# Patient Record
Sex: Male | Born: 1970 | Hispanic: Refuse to answer | Marital: Married | Smoking: Never smoker
Health system: Southern US, Community
[De-identification: ages and names within clinical notes are randomized; demographics above are authoritative.]

## PROBLEM LIST (undated history)

## (undated) DIAGNOSIS — G4733 Obstructive sleep apnea (adult) (pediatric): Secondary | ICD-10-CM

## (undated) DIAGNOSIS — E782 Mixed hyperlipidemia: Secondary | ICD-10-CM

## (undated) DIAGNOSIS — E785 Hyperlipidemia, unspecified: Secondary | ICD-10-CM

## (undated) DIAGNOSIS — F330 Major depressive disorder, recurrent, mild: Secondary | ICD-10-CM

## (undated) DIAGNOSIS — F32A Depression, unspecified: Secondary | ICD-10-CM

## (undated) DIAGNOSIS — N529 Male erectile dysfunction, unspecified: Secondary | ICD-10-CM

## (undated) DIAGNOSIS — G4700 Insomnia, unspecified: Secondary | ICD-10-CM

## (undated) DIAGNOSIS — I10 Essential (primary) hypertension: Secondary | ICD-10-CM

## (undated) DIAGNOSIS — G4730 Sleep apnea, unspecified: Secondary | ICD-10-CM

## (undated) DIAGNOSIS — F439 Reaction to severe stress, unspecified: Secondary | ICD-10-CM

## (undated) DIAGNOSIS — F329 Major depressive disorder, single episode, unspecified: Secondary | ICD-10-CM

## (undated) DIAGNOSIS — M109 Gout, unspecified: Secondary | ICD-10-CM

## (undated) DIAGNOSIS — F419 Anxiety disorder, unspecified: Secondary | ICD-10-CM

## (undated) DIAGNOSIS — G43909 Migraine, unspecified, not intractable, without status migrainosus: Secondary | ICD-10-CM

## (undated) DIAGNOSIS — J309 Allergic rhinitis, unspecified: Secondary | ICD-10-CM

## (undated) DIAGNOSIS — J45909 Unspecified asthma, uncomplicated: Secondary | ICD-10-CM

## (undated) DIAGNOSIS — K219 Gastro-esophageal reflux disease without esophagitis: Secondary | ICD-10-CM

## (undated) DIAGNOSIS — E669 Obesity, unspecified: Secondary | ICD-10-CM

## (undated) DIAGNOSIS — T7840XA Allergy, unspecified, initial encounter: Secondary | ICD-10-CM

## (undated) HISTORY — DX: Allergic rhinitis, unspecified: J30.9

## (undated) HISTORY — DX: Unspecified asthma, uncomplicated: J45.909

## (undated) HISTORY — DX: Major depressive disorder, single episode, unspecified: F32.9

## (undated) HISTORY — DX: Obstructive sleep apnea (adult) (pediatric): G47.33

## (undated) HISTORY — DX: Reaction to severe stress, unspecified: F43.9

## (undated) HISTORY — PX: TONSILLECTOMY: SUR1361

## (undated) HISTORY — DX: Gastro-esophageal reflux disease without esophagitis: K21.9

## (undated) HISTORY — DX: Major depressive disorder, recurrent, mild: F33.0

## (undated) HISTORY — DX: Allergy, unspecified, initial encounter: T78.40XA

## (undated) HISTORY — PX: COLONOSCOPY: SHX174

## (undated) HISTORY — DX: Migraine, unspecified, not intractable, without status migrainosus: G43.909

## (undated) HISTORY — DX: Mixed hyperlipidemia: E78.2

## (undated) HISTORY — DX: Essential (primary) hypertension: I10

## (undated) HISTORY — DX: Male erectile dysfunction, unspecified: N52.9

## (undated) HISTORY — DX: Insomnia, unspecified: G47.00

## (undated) HISTORY — DX: Obesity, unspecified: E66.9

## (undated) HISTORY — DX: Gout, unspecified: M10.9

## (undated) HISTORY — DX: Depression, unspecified: F32.A

## (undated) HISTORY — DX: Hyperlipidemia, unspecified: E78.5

## (undated) HISTORY — DX: Anxiety disorder, unspecified: F41.9

## (undated) HISTORY — DX: Sleep apnea, unspecified: G47.30

---

## 2006-03-02 ENCOUNTER — Encounter: Admission: RE | Admit: 2006-03-02 | Discharge: 2006-03-02 | Payer: Self-pay | Admitting: Family Medicine

## 2006-03-30 ENCOUNTER — Ambulatory Visit (HOSPITAL_BASED_OUTPATIENT_CLINIC_OR_DEPARTMENT_OTHER): Admission: RE | Admit: 2006-03-30 | Discharge: 2006-03-30 | Payer: Self-pay | Admitting: Family Medicine

## 2006-04-02 ENCOUNTER — Ambulatory Visit: Payer: Self-pay | Admitting: Internal Medicine

## 2006-09-02 ENCOUNTER — Encounter: Admission: RE | Admit: 2006-09-02 | Discharge: 2006-09-02 | Payer: Self-pay | Admitting: Family Medicine

## 2006-12-06 ENCOUNTER — Encounter: Admission: RE | Admit: 2006-12-06 | Discharge: 2006-12-06 | Payer: Self-pay | Admitting: Family Medicine

## 2009-11-12 ENCOUNTER — Encounter: Admission: RE | Admit: 2009-11-12 | Discharge: 2009-11-12 | Payer: Self-pay | Admitting: Sports Medicine

## 2010-02-18 ENCOUNTER — Ambulatory Visit (HOSPITAL_COMMUNITY): Admission: AD | Admit: 2010-02-18 | Discharge: 2010-02-18 | Payer: Self-pay | Admitting: Cardiology

## 2010-04-25 ENCOUNTER — Inpatient Hospital Stay (HOSPITAL_COMMUNITY)
Admission: AD | Admit: 2010-04-25 | Discharge: 2010-04-27 | Payer: Self-pay | Source: Home / Self Care | Admitting: Psychiatry

## 2010-05-01 ENCOUNTER — Ambulatory Visit (HOSPITAL_COMMUNITY)
Admission: RE | Admit: 2010-05-01 | Discharge: 2010-05-01 | Payer: Self-pay | Source: Home / Self Care | Attending: Psychiatry | Admitting: Psychiatry

## 2010-05-05 ENCOUNTER — Other Ambulatory Visit (HOSPITAL_COMMUNITY): Admission: RE | Admit: 2010-05-05 | Payer: Self-pay | Source: Home / Self Care | Admitting: Psychiatry

## 2010-05-07 ENCOUNTER — Inpatient Hospital Stay (HOSPITAL_COMMUNITY)
Admission: RE | Admit: 2010-05-07 | Discharge: 2010-05-15 | Payer: Self-pay | Source: Home / Self Care | Attending: Psychiatry | Admitting: Psychiatry

## 2010-08-03 LAB — URINE DRUGS OF ABUSE SCREEN W ALC, ROUTINE (REF LAB)
Amphetamine Screen, Ur: NEGATIVE
Barbiturate Quant, Ur: NEGATIVE
Benzodiazepines.: POSITIVE — AB
Cocaine Metabolites: NEGATIVE
Creatinine,U: 298.8 mg/dL
Ethyl Alcohol: <10 mg/dL (ref <10)
Marijuana Metabolite: NEGATIVE
Methadone: NEGATIVE
Opiate Screen, Urine: NEGATIVE
Phencyclidine (PCP): NEGATIVE
Propoxyphene: NEGATIVE

## 2010-08-03 LAB — COMPREHENSIVE METABOLIC PANEL
ALT: 29 U/L (ref 0–53)
AST: 17 U/L (ref 0–37)
Albumin: 3.6 g/dL (ref 3.5–5.2)
Alkaline Phosphatase: 64 U/L (ref 39–117)
BUN: 16 mg/dL (ref 6–23)
CO2: 30 mEq/L (ref 19–32)
Calcium: 9.4 mg/dL (ref 8.4–10.5)
Chloride: 104 mEq/L (ref 96–112)
Creatinine, Ser: 1.07 mg/dL (ref 0.4–1.5)
GFR calc Af Amer: >60 mL/min (ref >60)
GFR calc non Af Amer: >60 mL/min (ref >60)
Glucose, Bld: 86 mg/dL (ref 70–99)
Potassium: 3.6 mEq/L (ref 3.5–5.1)
Sodium: 142 mEq/L (ref 135–145)
Total Bilirubin: 0.5 mg/dL (ref 0.3–1.2)
Total Protein: 7.1 g/dL (ref 6.0–8.3)

## 2010-08-03 LAB — BENZODIAZEPINE, QUANTITATIVE, URINE
Alprazolam (GC/LC/MS), ur confirm: NEGATIVE NG/ML
Flurazepam GC/MS Conf: NEGATIVE NG/ML
Lorazepam UR QT: NEGATIVE NG/ML
Nordiazepam GC/MS Conf: NEGATIVE NG/ML
Oxazepam GC/MS Conf: 368 NG/ML — ABNORMAL HIGH
Temazepam GC/MS Conf: NEGATIVE NG/ML

## 2010-08-04 LAB — CBC
HCT: 40.3 % (ref 39.0–52.0)
Hemoglobin: 13.9 g/dL (ref 13.0–17.0)
MCH: 28.5 pg (ref 26.0–34.0)
MCHC: 34.5 g/dL (ref 30.0–36.0)
MCV: 82.8 fL (ref 78.0–100.0)
Platelets: 267 10*3/uL (ref 150–400)
RBC: 4.87 MIL/uL (ref 4.22–5.81)
RDW: 15 % (ref 11.5–15.5)
WBC: 10.8 10*3/uL — ABNORMAL HIGH (ref 4.0–10.5)

## 2010-08-04 LAB — COMPREHENSIVE METABOLIC PANEL
ALT: 32 U/L (ref 0–53)
AST: 21 U/L (ref 0–37)
Albumin: 4 g/dL (ref 3.5–5.2)
Alkaline Phosphatase: 50 U/L (ref 39–117)
BUN: 12 mg/dL (ref 6–23)
CO2: 28 mEq/L (ref 19–32)
Calcium: 9.7 mg/dL (ref 8.4–10.5)
Chloride: 101 mEq/L (ref 96–112)
Creatinine, Ser: 1.08 mg/dL (ref 0.4–1.5)
GFR calc Af Amer: >60 mL/min (ref >60)
GFR calc non Af Amer: >60 mL/min (ref >60)
Glucose, Bld: 100 mg/dL — ABNORMAL HIGH (ref 70–99)
Potassium: 3.8 mEq/L (ref 3.5–5.1)
Sodium: 140 mEq/L (ref 135–145)
Total Bilirubin: 0.6 mg/dL (ref 0.3–1.2)
Total Protein: 7.2 g/dL (ref 6.0–8.3)

## 2010-08-04 LAB — TSH: TSH: 0.999 u[IU]/mL (ref 0.350–4.500)

## 2010-08-06 LAB — CBC
HCT: 44.2 % (ref 39.0–52.0)
Hemoglobin: 15.3 g/dL (ref 13.0–17.0)
MCH: 28.3 pg (ref 26.0–34.0)
MCHC: 34.6 g/dL (ref 30.0–36.0)
MCV: 81.9 fL (ref 78.0–100.0)
Platelets: 217 10*3/uL (ref 150–400)
RBC: 5.4 MIL/uL (ref 4.22–5.81)
RDW: 14.1 % (ref 11.5–15.5)
WBC: 9.9 10*3/uL (ref 4.0–10.5)

## 2010-08-06 LAB — BASIC METABOLIC PANEL
BUN: 10 mg/dL (ref 6–23)
CO2: 28 mEq/L (ref 19–32)
Calcium: 9.8 mg/dL (ref 8.4–10.5)
Chloride: 105 mEq/L (ref 96–112)
Creatinine, Ser: 1.07 mg/dL (ref 0.4–1.5)
GFR calc Af Amer: >60 mL/min (ref >60)
GFR calc non Af Amer: >60 mL/min (ref >60)
Glucose, Bld: 112 mg/dL — ABNORMAL HIGH (ref 70–99)
Potassium: 3.7 mEq/L (ref 3.5–5.1)
Sodium: 139 mEq/L (ref 135–145)

## 2010-08-06 LAB — PROTIME-INR
INR: 1 (ref 0.00–1.49)
Prothrombin Time: 13.4 seconds (ref 11.6–15.2)

## 2010-08-06 LAB — APTT: aPTT: 26 seconds (ref 24–37)

## 2010-10-09 NOTE — Procedures (Signed)
NAME:  Ronald Roy, BUSCHE            ACCOUNT NO.:  [id number]   MEDICAL RECORD NO.:  [id number]          PATIENT TYPE:  OUT   LOCATION:  SLEEP CENTER                 FACILITY:  [facility]   PHYSICIAN:  Clinton D. Maple Hudson, MD, FCCP, FACPDATE OF BIRTH:  03/04/70   DATE OF STUDY:  03/30/2006                              NOCTURNAL POLYSOMNOGRAM   REFERRING PHYSICIAN:  Quita Skye. Artis Flock, M.D.   INDICATION FOR STUDY:  Hypersomnia with sleep apnea.   EPWORTH SLEEPINESS SCORE:  9/24, BMI 40, weight 300 pounds.   HOME MEDICATIONS:  Atarax, Klonopin.   SLEEP ARCHITECTURE:  Total sleep time 364 minutes with sleep efficiency 76%.  Stage I was 15%, stage II 49%, stages III and IV 13%, REM 22% of total sleep  time.  Sleep latency 25 minutes, REM latency 261 minutes.  Awake after sleep  onset 93 minutes.  Arousal index 29.8.  Atarax and Klonopin were taken at  2145 hours.   RESPIRATORY DATA:  A split study protocol.  Apnea-hypopnea index (AHI, RDI)  19.1 obstructive events per hour, indicating moderate obstructive sleep  apnea/hypopnea syndrome.  This included 10 obstructive apneas and 30  hypopneas before CPAP.  Events were not positional.  REM AHI 2.2 per hour.  CPAP was titrated to 17 CWP, AHI 1 per hour.  A medium ResMed Ultra Mirage  full face mask was used with a heated humidifier.   OXYGEN DATA:  Loud snoring with oxygen desaturation to a nadir of 88% before  CPAP.  After CPAP control, saturation held 96% on room air.   CARDIAC DATA:  Normal sinus rhythm.   MOVEMENT-PARASOMNIA:  A total of 48 limb jerks were recorded, of which 39  were associated with arousal or awakening for a periodic limb movement with  arousal index of 6.3 per hour, which is increased.   IMPRESSIONS-RECOMMENDATIONS:  1. Moderate obstructive sleep apnea/hypopnea syndrome, AHI 19.1 per hour,      with nonpositional events, loud snoring and oxygen desaturation to 88%.  2. Successful CPAP titration to 17 CWP, AHI 1 per hour.   A medium ResMed      Ultra Mirage full face mask was      used with a heated humidifier.  3. Periodic limb movement with arousal, 6.3 per hour.      Clinton D. Maple Hudson, MD, [facility], FACP  Diplomate, Biomedical engineer of Sleep Medicine  Electronically Signed     CDY/MEDQ  D:  04/02/2006 11:52:17  T:  04/02/2006 15:49:43  Job:  [id number]

## 2012-07-10 ENCOUNTER — Other Ambulatory Visit (HOSPITAL_COMMUNITY): Payer: Self-pay | Admitting: Orthopedic Surgery

## 2012-07-10 DIAGNOSIS — M25562 Pain in left knee: Secondary | ICD-10-CM

## 2012-07-11 ENCOUNTER — Ambulatory Visit (HOSPITAL_COMMUNITY)
Admission: RE | Admit: 2012-07-11 | Discharge: 2012-07-11 | Disposition: A | Discharge status: Home / Self Care | Payer: BC Managed Care – PPO | Source: Ambulatory Visit | Attending: Orthopedic Surgery | Admitting: Orthopedic Surgery

## 2012-07-11 DIAGNOSIS — M2390 Unspecified internal derangement of unspecified knee: Secondary | ICD-10-CM | POA: Insufficient documentation

## 2012-07-11 DIAGNOSIS — M25562 Pain in left knee: Secondary | ICD-10-CM

## 2012-07-11 DIAGNOSIS — M25569 Pain in unspecified knee: Secondary | ICD-10-CM | POA: Insufficient documentation

## 2015-02-27 ENCOUNTER — Other Ambulatory Visit: Payer: Self-pay | Admitting: Orthopedic Surgery

## 2015-02-27 DIAGNOSIS — M545 Low back pain: Secondary | ICD-10-CM

## 2015-03-13 ENCOUNTER — Other Ambulatory Visit: Payer: Self-pay

## 2015-03-18 ENCOUNTER — Ambulatory Visit
Admission: RE | Admit: 2015-03-18 | Discharge: 2015-03-18 | Disposition: A | Discharge status: Home / Self Care | Payer: BLUE CROSS/BLUE SHIELD | Source: Ambulatory Visit | Attending: Orthopedic Surgery | Admitting: Orthopedic Surgery

## 2015-03-18 DIAGNOSIS — M545 Low back pain: Secondary | ICD-10-CM

## 2015-07-03 ENCOUNTER — Ambulatory Visit (INDEPENDENT_AMBULATORY_CARE_PROVIDER_SITE_OTHER): Payer: 59 | Admitting: Physician Assistant

## 2015-07-03 VITALS — BP 132/80 | HR 79 | Temp 98.2°F | Resp 18 | Ht 72.0 in | Wt 330.8 lb

## 2015-07-03 DIAGNOSIS — J020 Streptococcal pharyngitis: Secondary | ICD-10-CM | POA: Diagnosis not present

## 2015-07-03 DIAGNOSIS — J069 Acute upper respiratory infection, unspecified: Secondary | ICD-10-CM | POA: Diagnosis not present

## 2015-07-03 DIAGNOSIS — J029 Acute pharyngitis, unspecified: Secondary | ICD-10-CM

## 2015-07-03 LAB — POCT RAPID STREP A (OFFICE): Rapid Strep A Screen: NEGATIVE

## 2015-07-03 NOTE — Patient Instructions (Signed)
Return if symptoms not improving in 1 week

## 2015-07-03 NOTE — Progress Notes (Signed)
[facility] [address], [place] [address] [phone]  Date:  07/03/2015   Name:  Ronald Roy   DOB:  December 26, 1970   MRN:  [id number]  PCP:  Ronald Kroner, MD    Chief Complaint: Sore Throat   History of Present Illness:  This is a 45 y.o. male who is presenting with mild sore throat, mild cough and mild nasal congestion. States he figures it is just a cold but wanted to make sure he does not have strep. States his girlfriend was recently dx'd with strep. He denies fever, chills, sob, wheezing, otalgia. Not taking anything for current symptoms.  Review of Systems:  Review of Systems See HPI  There are no active problems to display for this patient.   Prior to Admission medications   Medication Sig Start Date End Date Taking? Authorizing Provider  carvedilol (COREG) 6.25 MG tablet Take 6.25 mg by mouth 2 (two) times daily with a meal.   Yes Historical Provider, MD  valsartan (DIOVAN) 160 MG tablet Take 160 mg by mouth daily.   Yes Historical Provider, MD    No Known Allergies  History reviewed. No pertinent past surgical history.  Social History  Substance Use Topics  . Smoking status: Never Smoker   . Smokeless tobacco: None  . Alcohol Use: No    Family History  Problem Relation Age of Onset  . Hypertension Mother   . Diabetes Father   . Hypertension Father   . Hypertension Sister   . Hypertension Maternal Grandmother   . Cancer Maternal Grandfather   . Cancer Paternal Grandmother   . Cancer Paternal Grandfather     Medication list has been reviewed and updated.  Physical Examination:  Physical Exam  Constitutional: He is oriented to person, place, and time. He appears well-developed and well-nourished. No distress.  HENT:  Head: Normocephalic and atraumatic.  Right Ear: Hearing, tympanic membrane, external ear and ear canal normal.  Left Ear: Hearing, tympanic membrane, external ear and ear canal normal.  Nose: Nose  normal.  Mouth/Throat: Uvula is midline and mucous membranes are normal. Posterior oropharyngeal erythema (mild) present.  Eyes: Conjunctivae and lids are normal. Right eye exhibits no discharge. Left eye exhibits no discharge. No scleral icterus.  Cardiovascular: Normal rate, regular rhythm, normal heart sounds and normal pulses.   No murmur heard. Pulmonary/Chest: Effort normal and breath sounds normal. No respiratory distress. He has no wheezes. He has no rhonchi. He has no rales.  Musculoskeletal: Normal range of motion.  Lymphadenopathy:       Head (right side): No submental, no submandibular and no tonsillar adenopathy present.       Head (left side): No submental, no submandibular and no tonsillar adenopathy present.    He has no cervical adenopathy.  Neurological: He is alert and oriented to person, place, and time.  Skin: Skin is warm, dry and intact. No lesion and no rash noted.  Psychiatric: He has a normal mood and affect. His speech is normal and behavior is normal. Thought content normal.    BP 132/80 mmHg  Pulse 79  Temp(Src) 98.2 F (36.8 C) (Oral)  Resp 18  Ht 6' (1.829 m)  Wt 330 lb 12.8 oz (150.05 kg)  BMI 44.85 kg/m2  SpO2 98%  Results for orders placed or performed in visit on 07/03/15  POCT rapid strep A  Result Value Ref Range   Rapid Strep A Screen Negative Negative    Assessment and  Plan:  1. Viral URI 2. Sore throat Rapid strep negative, culture pending. Treat as viral illness. Discussed supportive care. Return in 1 week if symptoms do not improve or at any time if symptoms worsen.  - POCT rapid strep A - Culture, Group A Strep   Ronald Roy. Ronald Roy, MHS [facility]  07/03/2015

## 2015-07-05 LAB — CULTURE, GROUP A STREP: Organism ID, Bacteria: NORMAL

## 2016-03-03 ENCOUNTER — Other Ambulatory Visit: Payer: Self-pay | Admitting: Family Medicine

## 2016-03-03 DIAGNOSIS — N5089 Other specified disorders of the male genital organs: Secondary | ICD-10-CM

## 2016-05-28 DIAGNOSIS — R6889 Other general symptoms and signs: Secondary | ICD-10-CM | POA: Diagnosis not present

## 2016-06-02 DIAGNOSIS — M109 Gout, unspecified: Secondary | ICD-10-CM | POA: Diagnosis not present

## 2016-06-02 DIAGNOSIS — I10 Essential (primary) hypertension: Secondary | ICD-10-CM | POA: Diagnosis not present

## 2016-07-27 DIAGNOSIS — M10072 Idiopathic gout, left ankle and foot: Secondary | ICD-10-CM | POA: Diagnosis not present

## 2016-07-30 DIAGNOSIS — M79673 Pain in unspecified foot: Secondary | ICD-10-CM | POA: Diagnosis not present

## 2016-07-30 DIAGNOSIS — M109 Gout, unspecified: Secondary | ICD-10-CM | POA: Diagnosis not present

## 2016-07-30 DIAGNOSIS — Z79899 Other long term (current) drug therapy: Secondary | ICD-10-CM | POA: Diagnosis not present

## 2016-07-30 DIAGNOSIS — M79672 Pain in left foot: Secondary | ICD-10-CM | POA: Diagnosis not present

## 2016-08-05 DIAGNOSIS — G4733 Obstructive sleep apnea (adult) (pediatric): Secondary | ICD-10-CM | POA: Diagnosis not present

## 2016-08-25 DIAGNOSIS — J321 Chronic frontal sinusitis: Secondary | ICD-10-CM | POA: Diagnosis not present

## 2016-08-30 DIAGNOSIS — M79673 Pain in unspecified foot: Secondary | ICD-10-CM | POA: Diagnosis not present

## 2016-08-30 DIAGNOSIS — M109 Gout, unspecified: Secondary | ICD-10-CM | POA: Diagnosis not present

## 2016-08-30 DIAGNOSIS — Z79899 Other long term (current) drug therapy: Secondary | ICD-10-CM | POA: Diagnosis not present

## 2016-09-29 DIAGNOSIS — G4733 Obstructive sleep apnea (adult) (pediatric): Secondary | ICD-10-CM | POA: Diagnosis not present

## 2016-10-01 DIAGNOSIS — M79673 Pain in unspecified foot: Secondary | ICD-10-CM | POA: Diagnosis not present

## 2016-10-01 DIAGNOSIS — Z79899 Other long term (current) drug therapy: Secondary | ICD-10-CM | POA: Diagnosis not present

## 2016-10-01 DIAGNOSIS — M109 Gout, unspecified: Secondary | ICD-10-CM | POA: Diagnosis not present

## 2016-10-22 DIAGNOSIS — E782 Mixed hyperlipidemia: Secondary | ICD-10-CM | POA: Diagnosis not present

## 2016-10-22 DIAGNOSIS — G43909 Migraine, unspecified, not intractable, without status migrainosus: Secondary | ICD-10-CM | POA: Diagnosis not present

## 2016-10-22 DIAGNOSIS — I10 Essential (primary) hypertension: Secondary | ICD-10-CM | POA: Diagnosis not present

## 2016-11-01 DIAGNOSIS — G4733 Obstructive sleep apnea (adult) (pediatric): Secondary | ICD-10-CM | POA: Diagnosis not present

## 2016-11-09 ENCOUNTER — Ambulatory Visit: Payer: BLUE CROSS/BLUE SHIELD | Admitting: Registered"

## 2016-11-16 DIAGNOSIS — M79673 Pain in unspecified foot: Secondary | ICD-10-CM | POA: Diagnosis not present

## 2016-11-16 DIAGNOSIS — M109 Gout, unspecified: Secondary | ICD-10-CM | POA: Diagnosis not present

## 2016-11-16 DIAGNOSIS — Z79899 Other long term (current) drug therapy: Secondary | ICD-10-CM | POA: Diagnosis not present

## 2016-11-29 DIAGNOSIS — G4733 Obstructive sleep apnea (adult) (pediatric): Secondary | ICD-10-CM | POA: Diagnosis not present

## 2016-11-30 ENCOUNTER — Ambulatory Visit: Payer: BLUE CROSS/BLUE SHIELD | Admitting: Registered"

## 2016-12-02 DIAGNOSIS — M109 Gout, unspecified: Secondary | ICD-10-CM | POA: Diagnosis not present

## 2016-12-27 DIAGNOSIS — G4733 Obstructive sleep apnea (adult) (pediatric): Secondary | ICD-10-CM | POA: Diagnosis not present

## 2017-01-10 DIAGNOSIS — M109 Gout, unspecified: Secondary | ICD-10-CM | POA: Diagnosis not present

## 2017-01-10 DIAGNOSIS — Z79899 Other long term (current) drug therapy: Secondary | ICD-10-CM | POA: Diagnosis not present

## 2017-01-10 DIAGNOSIS — M79673 Pain in unspecified foot: Secondary | ICD-10-CM | POA: Diagnosis not present

## 2017-04-06 DIAGNOSIS — G4733 Obstructive sleep apnea (adult) (pediatric): Secondary | ICD-10-CM | POA: Diagnosis not present

## 2017-04-25 DIAGNOSIS — M5126 Other intervertebral disc displacement, lumbar region: Secondary | ICD-10-CM | POA: Diagnosis not present

## 2017-04-25 DIAGNOSIS — M5127 Other intervertebral disc displacement, lumbosacral region: Secondary | ICD-10-CM | POA: Diagnosis not present

## 2017-04-25 DIAGNOSIS — M9903 Segmental and somatic dysfunction of lumbar region: Secondary | ICD-10-CM | POA: Diagnosis not present

## 2017-04-28 DIAGNOSIS — M9903 Segmental and somatic dysfunction of lumbar region: Secondary | ICD-10-CM | POA: Diagnosis not present

## 2017-04-28 DIAGNOSIS — M5126 Other intervertebral disc displacement, lumbar region: Secondary | ICD-10-CM | POA: Diagnosis not present

## 2017-04-28 DIAGNOSIS — M5127 Other intervertebral disc displacement, lumbosacral region: Secondary | ICD-10-CM | POA: Diagnosis not present

## 2017-05-03 DIAGNOSIS — M5127 Other intervertebral disc displacement, lumbosacral region: Secondary | ICD-10-CM | POA: Diagnosis not present

## 2017-05-03 DIAGNOSIS — M9903 Segmental and somatic dysfunction of lumbar region: Secondary | ICD-10-CM | POA: Diagnosis not present

## 2017-05-03 DIAGNOSIS — M5126 Other intervertebral disc displacement, lumbar region: Secondary | ICD-10-CM | POA: Diagnosis not present

## 2017-05-05 DIAGNOSIS — M5127 Other intervertebral disc displacement, lumbosacral region: Secondary | ICD-10-CM | POA: Diagnosis not present

## 2017-05-05 DIAGNOSIS — M5126 Other intervertebral disc displacement, lumbar region: Secondary | ICD-10-CM | POA: Diagnosis not present

## 2017-05-05 DIAGNOSIS — M9903 Segmental and somatic dysfunction of lumbar region: Secondary | ICD-10-CM | POA: Diagnosis not present

## 2017-05-09 DIAGNOSIS — M5127 Other intervertebral disc displacement, lumbosacral region: Secondary | ICD-10-CM | POA: Diagnosis not present

## 2017-05-09 DIAGNOSIS — M9903 Segmental and somatic dysfunction of lumbar region: Secondary | ICD-10-CM | POA: Diagnosis not present

## 2017-05-09 DIAGNOSIS — M5126 Other intervertebral disc displacement, lumbar region: Secondary | ICD-10-CM | POA: Diagnosis not present

## 2017-05-11 DIAGNOSIS — M5127 Other intervertebral disc displacement, lumbosacral region: Secondary | ICD-10-CM | POA: Diagnosis not present

## 2017-05-11 DIAGNOSIS — M9903 Segmental and somatic dysfunction of lumbar region: Secondary | ICD-10-CM | POA: Diagnosis not present

## 2017-05-11 DIAGNOSIS — M5126 Other intervertebral disc displacement, lumbar region: Secondary | ICD-10-CM | POA: Diagnosis not present

## 2017-06-10 DIAGNOSIS — Z23 Encounter for immunization: Secondary | ICD-10-CM | POA: Diagnosis not present

## 2017-06-10 DIAGNOSIS — I10 Essential (primary) hypertension: Secondary | ICD-10-CM | POA: Diagnosis not present

## 2017-06-10 DIAGNOSIS — G43909 Migraine, unspecified, not intractable, without status migrainosus: Secondary | ICD-10-CM | POA: Diagnosis not present

## 2017-06-10 DIAGNOSIS — E782 Mixed hyperlipidemia: Secondary | ICD-10-CM | POA: Diagnosis not present

## 2017-06-22 DIAGNOSIS — M109 Gout, unspecified: Secondary | ICD-10-CM | POA: Diagnosis not present

## 2017-06-23 DIAGNOSIS — Z79899 Other long term (current) drug therapy: Secondary | ICD-10-CM | POA: Diagnosis not present

## 2017-06-23 DIAGNOSIS — M109 Gout, unspecified: Secondary | ICD-10-CM | POA: Diagnosis not present

## 2017-06-23 DIAGNOSIS — M79673 Pain in unspecified foot: Secondary | ICD-10-CM | POA: Diagnosis not present

## 2017-06-27 DIAGNOSIS — E782 Mixed hyperlipidemia: Secondary | ICD-10-CM | POA: Diagnosis not present

## 2017-06-27 DIAGNOSIS — M109 Gout, unspecified: Secondary | ICD-10-CM | POA: Diagnosis not present

## 2017-07-02 DIAGNOSIS — J069 Acute upper respiratory infection, unspecified: Secondary | ICD-10-CM | POA: Diagnosis not present

## 2017-07-04 DIAGNOSIS — R05 Cough: Secondary | ICD-10-CM | POA: Diagnosis not present

## 2017-07-04 DIAGNOSIS — J0101 Acute recurrent maxillary sinusitis: Secondary | ICD-10-CM | POA: Diagnosis not present

## 2017-07-13 DIAGNOSIS — G4733 Obstructive sleep apnea (adult) (pediatric): Secondary | ICD-10-CM | POA: Diagnosis not present

## 2017-07-18 DIAGNOSIS — H5213 Myopia, bilateral: Secondary | ICD-10-CM | POA: Diagnosis not present

## 2017-09-20 DIAGNOSIS — E790 Hyperuricemia without signs of inflammatory arthritis and tophaceous disease: Secondary | ICD-10-CM | POA: Diagnosis not present

## 2017-09-20 DIAGNOSIS — Z79899 Other long term (current) drug therapy: Secondary | ICD-10-CM | POA: Diagnosis not present

## 2017-09-20 DIAGNOSIS — M109 Gout, unspecified: Secondary | ICD-10-CM | POA: Diagnosis not present

## 2017-10-18 DIAGNOSIS — G4733 Obstructive sleep apnea (adult) (pediatric): Secondary | ICD-10-CM | POA: Diagnosis not present

## 2017-10-25 DIAGNOSIS — G4733 Obstructive sleep apnea (adult) (pediatric): Secondary | ICD-10-CM | POA: Diagnosis not present

## 2017-10-25 DIAGNOSIS — G4700 Insomnia, unspecified: Secondary | ICD-10-CM | POA: Diagnosis not present

## 2017-11-11 DIAGNOSIS — R05 Cough: Secondary | ICD-10-CM | POA: Diagnosis not present

## 2017-11-11 DIAGNOSIS — J301 Allergic rhinitis due to pollen: Secondary | ICD-10-CM | POA: Diagnosis not present

## 2017-11-11 DIAGNOSIS — J3081 Allergic rhinitis due to animal (cat) (dog) hair and dander: Secondary | ICD-10-CM | POA: Diagnosis not present

## 2017-11-11 DIAGNOSIS — J3089 Other allergic rhinitis: Secondary | ICD-10-CM | POA: Diagnosis not present

## 2017-11-23 DIAGNOSIS — J3089 Other allergic rhinitis: Secondary | ICD-10-CM | POA: Diagnosis not present

## 2017-11-23 DIAGNOSIS — J301 Allergic rhinitis due to pollen: Secondary | ICD-10-CM | POA: Diagnosis not present

## 2017-11-28 DIAGNOSIS — J3081 Allergic rhinitis due to animal (cat) (dog) hair and dander: Secondary | ICD-10-CM | POA: Diagnosis not present

## 2017-11-30 DIAGNOSIS — J3081 Allergic rhinitis due to animal (cat) (dog) hair and dander: Secondary | ICD-10-CM | POA: Diagnosis not present

## 2017-11-30 DIAGNOSIS — J3089 Other allergic rhinitis: Secondary | ICD-10-CM | POA: Diagnosis not present

## 2017-11-30 DIAGNOSIS — J301 Allergic rhinitis due to pollen: Secondary | ICD-10-CM | POA: Diagnosis not present

## 2017-12-05 DIAGNOSIS — J3081 Allergic rhinitis due to animal (cat) (dog) hair and dander: Secondary | ICD-10-CM | POA: Diagnosis not present

## 2017-12-05 DIAGNOSIS — J3089 Other allergic rhinitis: Secondary | ICD-10-CM | POA: Diagnosis not present

## 2017-12-05 DIAGNOSIS — J301 Allergic rhinitis due to pollen: Secondary | ICD-10-CM | POA: Diagnosis not present

## 2017-12-07 DIAGNOSIS — J3089 Other allergic rhinitis: Secondary | ICD-10-CM | POA: Diagnosis not present

## 2017-12-07 DIAGNOSIS — J301 Allergic rhinitis due to pollen: Secondary | ICD-10-CM | POA: Diagnosis not present

## 2017-12-08 DIAGNOSIS — E782 Mixed hyperlipidemia: Secondary | ICD-10-CM | POA: Diagnosis not present

## 2017-12-08 DIAGNOSIS — G4733 Obstructive sleep apnea (adult) (pediatric): Secondary | ICD-10-CM | POA: Diagnosis not present

## 2017-12-08 DIAGNOSIS — I10 Essential (primary) hypertension: Secondary | ICD-10-CM | POA: Diagnosis not present

## 2017-12-09 DIAGNOSIS — J3089 Other allergic rhinitis: Secondary | ICD-10-CM | POA: Diagnosis not present

## 2017-12-09 DIAGNOSIS — J3081 Allergic rhinitis due to animal (cat) (dog) hair and dander: Secondary | ICD-10-CM | POA: Diagnosis not present

## 2017-12-09 DIAGNOSIS — J301 Allergic rhinitis due to pollen: Secondary | ICD-10-CM | POA: Diagnosis not present

## 2017-12-12 DIAGNOSIS — J301 Allergic rhinitis due to pollen: Secondary | ICD-10-CM | POA: Diagnosis not present

## 2017-12-12 DIAGNOSIS — J3081 Allergic rhinitis due to animal (cat) (dog) hair and dander: Secondary | ICD-10-CM | POA: Diagnosis not present

## 2017-12-12 DIAGNOSIS — J3089 Other allergic rhinitis: Secondary | ICD-10-CM | POA: Diagnosis not present

## 2017-12-14 DIAGNOSIS — J301 Allergic rhinitis due to pollen: Secondary | ICD-10-CM | POA: Diagnosis not present

## 2017-12-14 DIAGNOSIS — J3081 Allergic rhinitis due to animal (cat) (dog) hair and dander: Secondary | ICD-10-CM | POA: Diagnosis not present

## 2017-12-14 DIAGNOSIS — J3089 Other allergic rhinitis: Secondary | ICD-10-CM | POA: Diagnosis not present

## 2017-12-16 DIAGNOSIS — J3081 Allergic rhinitis due to animal (cat) (dog) hair and dander: Secondary | ICD-10-CM | POA: Diagnosis not present

## 2017-12-16 DIAGNOSIS — J301 Allergic rhinitis due to pollen: Secondary | ICD-10-CM | POA: Diagnosis not present

## 2017-12-16 DIAGNOSIS — J3089 Other allergic rhinitis: Secondary | ICD-10-CM | POA: Diagnosis not present

## 2017-12-19 DIAGNOSIS — J3089 Other allergic rhinitis: Secondary | ICD-10-CM | POA: Diagnosis not present

## 2017-12-19 DIAGNOSIS — J3081 Allergic rhinitis due to animal (cat) (dog) hair and dander: Secondary | ICD-10-CM | POA: Diagnosis not present

## 2017-12-19 DIAGNOSIS — J301 Allergic rhinitis due to pollen: Secondary | ICD-10-CM | POA: Diagnosis not present

## 2017-12-26 DIAGNOSIS — J3089 Other allergic rhinitis: Secondary | ICD-10-CM | POA: Diagnosis not present

## 2017-12-26 DIAGNOSIS — J3081 Allergic rhinitis due to animal (cat) (dog) hair and dander: Secondary | ICD-10-CM | POA: Diagnosis not present

## 2017-12-26 DIAGNOSIS — J301 Allergic rhinitis due to pollen: Secondary | ICD-10-CM | POA: Diagnosis not present

## 2017-12-28 DIAGNOSIS — J301 Allergic rhinitis due to pollen: Secondary | ICD-10-CM | POA: Diagnosis not present

## 2017-12-28 DIAGNOSIS — R05 Cough: Secondary | ICD-10-CM | POA: Diagnosis not present

## 2017-12-28 DIAGNOSIS — J4530 Mild persistent asthma, uncomplicated: Secondary | ICD-10-CM | POA: Diagnosis not present

## 2017-12-28 DIAGNOSIS — J3089 Other allergic rhinitis: Secondary | ICD-10-CM | POA: Diagnosis not present

## 2017-12-28 DIAGNOSIS — J3081 Allergic rhinitis due to animal (cat) (dog) hair and dander: Secondary | ICD-10-CM | POA: Diagnosis not present

## 2017-12-30 DIAGNOSIS — J3081 Allergic rhinitis due to animal (cat) (dog) hair and dander: Secondary | ICD-10-CM | POA: Diagnosis not present

## 2017-12-30 DIAGNOSIS — J3089 Other allergic rhinitis: Secondary | ICD-10-CM | POA: Diagnosis not present

## 2017-12-30 DIAGNOSIS — J301 Allergic rhinitis due to pollen: Secondary | ICD-10-CM | POA: Diagnosis not present

## 2018-01-03 DIAGNOSIS — J301 Allergic rhinitis due to pollen: Secondary | ICD-10-CM | POA: Diagnosis not present

## 2018-01-03 DIAGNOSIS — J3089 Other allergic rhinitis: Secondary | ICD-10-CM | POA: Diagnosis not present

## 2018-01-03 DIAGNOSIS — J3081 Allergic rhinitis due to animal (cat) (dog) hair and dander: Secondary | ICD-10-CM | POA: Diagnosis not present

## 2018-01-05 DIAGNOSIS — J301 Allergic rhinitis due to pollen: Secondary | ICD-10-CM | POA: Diagnosis not present

## 2018-01-05 DIAGNOSIS — J3089 Other allergic rhinitis: Secondary | ICD-10-CM | POA: Diagnosis not present

## 2018-01-05 DIAGNOSIS — J3081 Allergic rhinitis due to animal (cat) (dog) hair and dander: Secondary | ICD-10-CM | POA: Diagnosis not present

## 2018-01-09 DIAGNOSIS — J3081 Allergic rhinitis due to animal (cat) (dog) hair and dander: Secondary | ICD-10-CM | POA: Diagnosis not present

## 2018-01-09 DIAGNOSIS — J3089 Other allergic rhinitis: Secondary | ICD-10-CM | POA: Diagnosis not present

## 2018-01-09 DIAGNOSIS — J301 Allergic rhinitis due to pollen: Secondary | ICD-10-CM | POA: Diagnosis not present

## 2018-01-11 DIAGNOSIS — J3089 Other allergic rhinitis: Secondary | ICD-10-CM | POA: Diagnosis not present

## 2018-01-11 DIAGNOSIS — J3081 Allergic rhinitis due to animal (cat) (dog) hair and dander: Secondary | ICD-10-CM | POA: Diagnosis not present

## 2018-01-11 DIAGNOSIS — J301 Allergic rhinitis due to pollen: Secondary | ICD-10-CM | POA: Diagnosis not present

## 2018-01-17 DIAGNOSIS — J3089 Other allergic rhinitis: Secondary | ICD-10-CM | POA: Diagnosis not present

## 2018-01-17 DIAGNOSIS — J3081 Allergic rhinitis due to animal (cat) (dog) hair and dander: Secondary | ICD-10-CM | POA: Diagnosis not present

## 2018-01-17 DIAGNOSIS — J301 Allergic rhinitis due to pollen: Secondary | ICD-10-CM | POA: Diagnosis not present

## 2018-01-20 DIAGNOSIS — J301 Allergic rhinitis due to pollen: Secondary | ICD-10-CM | POA: Diagnosis not present

## 2018-01-20 DIAGNOSIS — J3081 Allergic rhinitis due to animal (cat) (dog) hair and dander: Secondary | ICD-10-CM | POA: Diagnosis not present

## 2018-01-20 DIAGNOSIS — J3089 Other allergic rhinitis: Secondary | ICD-10-CM | POA: Diagnosis not present

## 2018-01-25 DIAGNOSIS — J3089 Other allergic rhinitis: Secondary | ICD-10-CM | POA: Diagnosis not present

## 2018-01-25 DIAGNOSIS — J301 Allergic rhinitis due to pollen: Secondary | ICD-10-CM | POA: Diagnosis not present

## 2018-01-25 DIAGNOSIS — J3081 Allergic rhinitis due to animal (cat) (dog) hair and dander: Secondary | ICD-10-CM | POA: Diagnosis not present

## 2018-01-27 DIAGNOSIS — J301 Allergic rhinitis due to pollen: Secondary | ICD-10-CM | POA: Diagnosis not present

## 2018-01-27 DIAGNOSIS — J3081 Allergic rhinitis due to animal (cat) (dog) hair and dander: Secondary | ICD-10-CM | POA: Diagnosis not present

## 2018-01-27 DIAGNOSIS — J3089 Other allergic rhinitis: Secondary | ICD-10-CM | POA: Diagnosis not present

## 2018-02-01 DIAGNOSIS — J3089 Other allergic rhinitis: Secondary | ICD-10-CM | POA: Diagnosis not present

## 2018-02-01 DIAGNOSIS — J3081 Allergic rhinitis due to animal (cat) (dog) hair and dander: Secondary | ICD-10-CM | POA: Diagnosis not present

## 2018-02-01 DIAGNOSIS — J301 Allergic rhinitis due to pollen: Secondary | ICD-10-CM | POA: Diagnosis not present

## 2018-02-03 DIAGNOSIS — J3081 Allergic rhinitis due to animal (cat) (dog) hair and dander: Secondary | ICD-10-CM | POA: Diagnosis not present

## 2018-02-03 DIAGNOSIS — J301 Allergic rhinitis due to pollen: Secondary | ICD-10-CM | POA: Diagnosis not present

## 2018-02-08 DIAGNOSIS — J3081 Allergic rhinitis due to animal (cat) (dog) hair and dander: Secondary | ICD-10-CM | POA: Diagnosis not present

## 2018-02-08 DIAGNOSIS — J3089 Other allergic rhinitis: Secondary | ICD-10-CM | POA: Diagnosis not present

## 2018-02-08 DIAGNOSIS — J301 Allergic rhinitis due to pollen: Secondary | ICD-10-CM | POA: Diagnosis not present

## 2018-02-10 DIAGNOSIS — J3081 Allergic rhinitis due to animal (cat) (dog) hair and dander: Secondary | ICD-10-CM | POA: Diagnosis not present

## 2018-02-10 DIAGNOSIS — J3089 Other allergic rhinitis: Secondary | ICD-10-CM | POA: Diagnosis not present

## 2018-02-10 DIAGNOSIS — J301 Allergic rhinitis due to pollen: Secondary | ICD-10-CM | POA: Diagnosis not present

## 2018-02-13 DIAGNOSIS — J3089 Other allergic rhinitis: Secondary | ICD-10-CM | POA: Diagnosis not present

## 2018-02-13 DIAGNOSIS — J301 Allergic rhinitis due to pollen: Secondary | ICD-10-CM | POA: Diagnosis not present

## 2018-02-13 DIAGNOSIS — J3081 Allergic rhinitis due to animal (cat) (dog) hair and dander: Secondary | ICD-10-CM | POA: Diagnosis not present

## 2018-02-17 DIAGNOSIS — J3089 Other allergic rhinitis: Secondary | ICD-10-CM | POA: Diagnosis not present

## 2018-02-17 DIAGNOSIS — J301 Allergic rhinitis due to pollen: Secondary | ICD-10-CM | POA: Diagnosis not present

## 2018-02-20 DIAGNOSIS — J3081 Allergic rhinitis due to animal (cat) (dog) hair and dander: Secondary | ICD-10-CM | POA: Diagnosis not present

## 2018-03-03 DIAGNOSIS — J3089 Other allergic rhinitis: Secondary | ICD-10-CM | POA: Diagnosis not present

## 2018-03-03 DIAGNOSIS — J3081 Allergic rhinitis due to animal (cat) (dog) hair and dander: Secondary | ICD-10-CM | POA: Diagnosis not present

## 2018-03-03 DIAGNOSIS — J301 Allergic rhinitis due to pollen: Secondary | ICD-10-CM | POA: Diagnosis not present

## 2018-03-06 DIAGNOSIS — J3081 Allergic rhinitis due to animal (cat) (dog) hair and dander: Secondary | ICD-10-CM | POA: Diagnosis not present

## 2018-03-06 DIAGNOSIS — J3089 Other allergic rhinitis: Secondary | ICD-10-CM | POA: Diagnosis not present

## 2018-03-06 DIAGNOSIS — J301 Allergic rhinitis due to pollen: Secondary | ICD-10-CM | POA: Diagnosis not present

## 2018-03-10 DIAGNOSIS — J019 Acute sinusitis, unspecified: Secondary | ICD-10-CM | POA: Diagnosis not present

## 2018-03-10 DIAGNOSIS — J301 Allergic rhinitis due to pollen: Secondary | ICD-10-CM | POA: Diagnosis not present

## 2018-03-10 DIAGNOSIS — J3081 Allergic rhinitis due to animal (cat) (dog) hair and dander: Secondary | ICD-10-CM | POA: Diagnosis not present

## 2018-03-10 DIAGNOSIS — J3089 Other allergic rhinitis: Secondary | ICD-10-CM | POA: Diagnosis not present

## 2018-03-10 DIAGNOSIS — J069 Acute upper respiratory infection, unspecified: Secondary | ICD-10-CM | POA: Diagnosis not present

## 2018-03-15 DIAGNOSIS — J3081 Allergic rhinitis due to animal (cat) (dog) hair and dander: Secondary | ICD-10-CM | POA: Diagnosis not present

## 2018-03-15 DIAGNOSIS — J3089 Other allergic rhinitis: Secondary | ICD-10-CM | POA: Diagnosis not present

## 2018-03-15 DIAGNOSIS — Z23 Encounter for immunization: Secondary | ICD-10-CM | POA: Diagnosis not present

## 2018-03-15 DIAGNOSIS — J301 Allergic rhinitis due to pollen: Secondary | ICD-10-CM | POA: Diagnosis not present

## 2018-03-17 DIAGNOSIS — J3081 Allergic rhinitis due to animal (cat) (dog) hair and dander: Secondary | ICD-10-CM | POA: Diagnosis not present

## 2018-03-20 DIAGNOSIS — J301 Allergic rhinitis due to pollen: Secondary | ICD-10-CM | POA: Diagnosis not present

## 2018-03-20 DIAGNOSIS — J3089 Other allergic rhinitis: Secondary | ICD-10-CM | POA: Diagnosis not present

## 2018-03-22 DIAGNOSIS — Z23 Encounter for immunization: Secondary | ICD-10-CM | POA: Diagnosis not present

## 2018-03-22 DIAGNOSIS — Z79899 Other long term (current) drug therapy: Secondary | ICD-10-CM | POA: Diagnosis not present

## 2018-03-22 DIAGNOSIS — J301 Allergic rhinitis due to pollen: Secondary | ICD-10-CM | POA: Diagnosis not present

## 2018-03-22 DIAGNOSIS — J3089 Other allergic rhinitis: Secondary | ICD-10-CM | POA: Diagnosis not present

## 2018-03-22 DIAGNOSIS — M109 Gout, unspecified: Secondary | ICD-10-CM | POA: Diagnosis not present

## 2018-03-28 DIAGNOSIS — J4530 Mild persistent asthma, uncomplicated: Secondary | ICD-10-CM | POA: Diagnosis not present

## 2018-03-28 DIAGNOSIS — J019 Acute sinusitis, unspecified: Secondary | ICD-10-CM | POA: Diagnosis not present

## 2018-03-28 DIAGNOSIS — J209 Acute bronchitis, unspecified: Secondary | ICD-10-CM | POA: Diagnosis not present

## 2018-03-28 DIAGNOSIS — J301 Allergic rhinitis due to pollen: Secondary | ICD-10-CM | POA: Diagnosis not present

## 2018-03-29 DIAGNOSIS — J3089 Other allergic rhinitis: Secondary | ICD-10-CM | POA: Diagnosis not present

## 2018-03-29 DIAGNOSIS — J301 Allergic rhinitis due to pollen: Secondary | ICD-10-CM | POA: Diagnosis not present

## 2018-04-10 DIAGNOSIS — J301 Allergic rhinitis due to pollen: Secondary | ICD-10-CM | POA: Diagnosis not present

## 2018-04-10 DIAGNOSIS — J3089 Other allergic rhinitis: Secondary | ICD-10-CM | POA: Diagnosis not present

## 2018-04-10 DIAGNOSIS — J3081 Allergic rhinitis due to animal (cat) (dog) hair and dander: Secondary | ICD-10-CM | POA: Diagnosis not present

## 2018-04-14 DIAGNOSIS — J3089 Other allergic rhinitis: Secondary | ICD-10-CM | POA: Diagnosis not present

## 2018-04-14 DIAGNOSIS — J301 Allergic rhinitis due to pollen: Secondary | ICD-10-CM | POA: Diagnosis not present

## 2018-04-18 DIAGNOSIS — J301 Allergic rhinitis due to pollen: Secondary | ICD-10-CM | POA: Diagnosis not present

## 2018-04-18 DIAGNOSIS — J3089 Other allergic rhinitis: Secondary | ICD-10-CM | POA: Diagnosis not present

## 2018-04-26 DIAGNOSIS — J3081 Allergic rhinitis due to animal (cat) (dog) hair and dander: Secondary | ICD-10-CM | POA: Diagnosis not present

## 2018-04-26 DIAGNOSIS — J301 Allergic rhinitis due to pollen: Secondary | ICD-10-CM | POA: Diagnosis not present

## 2018-04-26 DIAGNOSIS — J3089 Other allergic rhinitis: Secondary | ICD-10-CM | POA: Diagnosis not present

## 2018-05-05 DIAGNOSIS — J301 Allergic rhinitis due to pollen: Secondary | ICD-10-CM | POA: Diagnosis not present

## 2018-05-05 DIAGNOSIS — J3081 Allergic rhinitis due to animal (cat) (dog) hair and dander: Secondary | ICD-10-CM | POA: Diagnosis not present

## 2018-05-05 DIAGNOSIS — J3089 Other allergic rhinitis: Secondary | ICD-10-CM | POA: Diagnosis not present

## 2018-05-10 DIAGNOSIS — J3089 Other allergic rhinitis: Secondary | ICD-10-CM | POA: Diagnosis not present

## 2018-05-10 DIAGNOSIS — J301 Allergic rhinitis due to pollen: Secondary | ICD-10-CM | POA: Diagnosis not present

## 2018-05-12 DIAGNOSIS — J3089 Other allergic rhinitis: Secondary | ICD-10-CM | POA: Diagnosis not present

## 2018-05-12 DIAGNOSIS — J301 Allergic rhinitis due to pollen: Secondary | ICD-10-CM | POA: Diagnosis not present

## 2018-05-18 DIAGNOSIS — J3081 Allergic rhinitis due to animal (cat) (dog) hair and dander: Secondary | ICD-10-CM | POA: Diagnosis not present

## 2018-05-18 DIAGNOSIS — J3089 Other allergic rhinitis: Secondary | ICD-10-CM | POA: Diagnosis not present

## 2018-05-18 DIAGNOSIS — J301 Allergic rhinitis due to pollen: Secondary | ICD-10-CM | POA: Diagnosis not present

## 2018-05-31 DIAGNOSIS — J3089 Other allergic rhinitis: Secondary | ICD-10-CM | POA: Diagnosis not present

## 2018-05-31 DIAGNOSIS — J301 Allergic rhinitis due to pollen: Secondary | ICD-10-CM | POA: Diagnosis not present

## 2018-06-02 DIAGNOSIS — J3089 Other allergic rhinitis: Secondary | ICD-10-CM | POA: Diagnosis not present

## 2018-06-02 DIAGNOSIS — J301 Allergic rhinitis due to pollen: Secondary | ICD-10-CM | POA: Diagnosis not present

## 2018-06-02 DIAGNOSIS — J3081 Allergic rhinitis due to animal (cat) (dog) hair and dander: Secondary | ICD-10-CM | POA: Diagnosis not present

## 2018-06-06 DIAGNOSIS — G4733 Obstructive sleep apnea (adult) (pediatric): Secondary | ICD-10-CM | POA: Diagnosis not present

## 2018-06-09 DIAGNOSIS — J301 Allergic rhinitis due to pollen: Secondary | ICD-10-CM | POA: Diagnosis not present

## 2018-06-09 DIAGNOSIS — J3089 Other allergic rhinitis: Secondary | ICD-10-CM | POA: Diagnosis not present

## 2018-06-15 DIAGNOSIS — M109 Gout, unspecified: Secondary | ICD-10-CM | POA: Diagnosis not present

## 2018-06-15 DIAGNOSIS — Z125 Encounter for screening for malignant neoplasm of prostate: Secondary | ICD-10-CM | POA: Diagnosis not present

## 2018-06-15 DIAGNOSIS — I10 Essential (primary) hypertension: Secondary | ICD-10-CM | POA: Diagnosis not present

## 2018-06-15 DIAGNOSIS — Z0000 Encounter for general adult medical examination without abnormal findings: Secondary | ICD-10-CM | POA: Diagnosis not present

## 2018-06-15 DIAGNOSIS — G4733 Obstructive sleep apnea (adult) (pediatric): Secondary | ICD-10-CM | POA: Diagnosis not present

## 2018-06-15 DIAGNOSIS — E782 Mixed hyperlipidemia: Secondary | ICD-10-CM | POA: Diagnosis not present

## 2018-06-16 DIAGNOSIS — J301 Allergic rhinitis due to pollen: Secondary | ICD-10-CM | POA: Diagnosis not present

## 2018-06-16 DIAGNOSIS — J3089 Other allergic rhinitis: Secondary | ICD-10-CM | POA: Diagnosis not present

## 2018-06-23 DIAGNOSIS — J3089 Other allergic rhinitis: Secondary | ICD-10-CM | POA: Diagnosis not present

## 2018-06-23 DIAGNOSIS — J301 Allergic rhinitis due to pollen: Secondary | ICD-10-CM | POA: Diagnosis not present

## 2018-06-26 DIAGNOSIS — J3089 Other allergic rhinitis: Secondary | ICD-10-CM | POA: Diagnosis not present

## 2018-06-26 DIAGNOSIS — J4530 Mild persistent asthma, uncomplicated: Secondary | ICD-10-CM | POA: Diagnosis not present

## 2018-06-26 DIAGNOSIS — J4531 Mild persistent asthma with (acute) exacerbation: Secondary | ICD-10-CM | POA: Diagnosis not present

## 2018-06-26 DIAGNOSIS — J301 Allergic rhinitis due to pollen: Secondary | ICD-10-CM | POA: Diagnosis not present

## 2018-06-30 DIAGNOSIS — J3081 Allergic rhinitis due to animal (cat) (dog) hair and dander: Secondary | ICD-10-CM | POA: Diagnosis not present

## 2018-06-30 DIAGNOSIS — J3089 Other allergic rhinitis: Secondary | ICD-10-CM | POA: Diagnosis not present

## 2018-06-30 DIAGNOSIS — J301 Allergic rhinitis due to pollen: Secondary | ICD-10-CM | POA: Diagnosis not present

## 2018-07-07 DIAGNOSIS — J3089 Other allergic rhinitis: Secondary | ICD-10-CM | POA: Diagnosis not present

## 2018-07-07 DIAGNOSIS — J301 Allergic rhinitis due to pollen: Secondary | ICD-10-CM | POA: Diagnosis not present

## 2018-07-26 DIAGNOSIS — J3081 Allergic rhinitis due to animal (cat) (dog) hair and dander: Secondary | ICD-10-CM | POA: Diagnosis not present

## 2018-07-26 DIAGNOSIS — J301 Allergic rhinitis due to pollen: Secondary | ICD-10-CM | POA: Diagnosis not present

## 2018-07-26 DIAGNOSIS — J3089 Other allergic rhinitis: Secondary | ICD-10-CM | POA: Diagnosis not present

## 2018-07-31 DIAGNOSIS — J3081 Allergic rhinitis due to animal (cat) (dog) hair and dander: Secondary | ICD-10-CM | POA: Diagnosis not present

## 2018-08-09 DIAGNOSIS — J301 Allergic rhinitis due to pollen: Secondary | ICD-10-CM | POA: Diagnosis not present

## 2018-08-09 DIAGNOSIS — J3089 Other allergic rhinitis: Secondary | ICD-10-CM | POA: Diagnosis not present

## 2018-08-16 DIAGNOSIS — J3089 Other allergic rhinitis: Secondary | ICD-10-CM | POA: Diagnosis not present

## 2018-08-16 DIAGNOSIS — J301 Allergic rhinitis due to pollen: Secondary | ICD-10-CM | POA: Diagnosis not present

## 2018-08-17 DIAGNOSIS — I10 Essential (primary) hypertension: Secondary | ICD-10-CM | POA: Diagnosis not present

## 2018-09-05 DIAGNOSIS — J301 Allergic rhinitis due to pollen: Secondary | ICD-10-CM | POA: Diagnosis not present

## 2018-09-05 DIAGNOSIS — J3081 Allergic rhinitis due to animal (cat) (dog) hair and dander: Secondary | ICD-10-CM | POA: Diagnosis not present

## 2018-09-05 DIAGNOSIS — J3089 Other allergic rhinitis: Secondary | ICD-10-CM | POA: Diagnosis not present

## 2018-09-14 DIAGNOSIS — J3089 Other allergic rhinitis: Secondary | ICD-10-CM | POA: Diagnosis not present

## 2018-09-14 DIAGNOSIS — J301 Allergic rhinitis due to pollen: Secondary | ICD-10-CM | POA: Diagnosis not present

## 2018-09-14 DIAGNOSIS — G4733 Obstructive sleep apnea (adult) (pediatric): Secondary | ICD-10-CM | POA: Diagnosis not present

## 2018-09-22 DIAGNOSIS — J301 Allergic rhinitis due to pollen: Secondary | ICD-10-CM | POA: Diagnosis not present

## 2018-09-22 DIAGNOSIS — J3089 Other allergic rhinitis: Secondary | ICD-10-CM | POA: Diagnosis not present

## 2018-09-29 DIAGNOSIS — J301 Allergic rhinitis due to pollen: Secondary | ICD-10-CM | POA: Diagnosis not present

## 2018-09-29 DIAGNOSIS — J3089 Other allergic rhinitis: Secondary | ICD-10-CM | POA: Diagnosis not present

## 2019-08-14 DIAGNOSIS — F330 Major depressive disorder, recurrent, mild: Secondary | ICD-10-CM | POA: Insufficient documentation

## 2019-08-15 ENCOUNTER — Other Ambulatory Visit: Payer: Self-pay

## 2019-08-15 ENCOUNTER — Ambulatory Visit (INDEPENDENT_AMBULATORY_CARE_PROVIDER_SITE_OTHER): Payer: 59 | Admitting: Cardiology

## 2019-08-15 ENCOUNTER — Encounter: Payer: Self-pay | Admitting: Cardiology

## 2019-08-15 VITALS — BP 130/90 | HR 48 | Ht 72.0 in | Wt 317.0 lb

## 2019-08-15 DIAGNOSIS — G4733 Obstructive sleep apnea (adult) (pediatric): Secondary | ICD-10-CM

## 2019-08-15 DIAGNOSIS — R0789 Other chest pain: Secondary | ICD-10-CM

## 2019-08-15 DIAGNOSIS — K219 Gastro-esophageal reflux disease without esophagitis: Secondary | ICD-10-CM | POA: Diagnosis not present

## 2019-08-15 DIAGNOSIS — R079 Chest pain, unspecified: Secondary | ICD-10-CM

## 2019-08-15 NOTE — Patient Instructions (Addendum)
Medication Instructions:  The current medical regimen is effective;  continue present plan and medications.  *If you need a refill on your cardiac medications before your next appointment, please call your pharmacy*  Testing/Procedures: Your physician has requested that you have Coronary CT. Cardiac computed tomography (CT) is a painless test that uses an x-ray machine to take clear, detailed pictures of your heart.  Please follow instruction sheet as given.  Follow-Up: Follow up as needed after the above testing.  Thank you for choosing [facility]!!    Your cardiac CT will be scheduled at:  [facility] [address] [place], [place] [address] [phone]   Please arrive at the [facility] main entrance of [facility] 30 minutes prior to test start time. Proceed to the [facility] Radiology Department (first floor) to check-in and test prep.  Please follow these instructions carefully (unless otherwise directed):  Hold all erectile dysfunction medications at least 3 days (72 hrs) prior to test.  On the Night Before the Test: . Be sure to Drink plenty of water. . Do not consume any caffeinated/decaffeinated beverages or chocolate 12 hours prior to your test. . Do not take any antihistamines 12 hours prior to your test.  On the Day of the Test: . Drink plenty of water. Do not drink any water within one hour of the test. . Do not eat any food 4 hours prior to the test. . You may take your regular medications prior to the test.  . Take your Bystolic this am. . HOLD Furosemide/Hydrochlorothiazide morning of the test.  After the Test: . Drink plenty of water. . After receiving IV contrast, you may experience a mild flushed feeling. This is normal. . On occasion, you may experience a mild rash up to 24 hours after the test. This is not dangerous. If this occurs, you can take Benadryl 25 mg and increase your fluid intake. . If you experience  trouble breathing, this can be serious. If it is severe call 911 IMMEDIATELY. If it is mild, please call our office. . If you take any of these medications: Glipizide/Metformin, Avandament, Glucavance, please do not take 48 hours after completing test unless otherwise instructed.   Once we have confirmed authorization from your insurance company, we will call you to set up a date and time for your test.   For non-scheduling related questions, please contact the cardiac imaging nurse navigator should you have any questions/concerns: Marchia Bond, RN Navigator Cardiac Imaging [facility] [phone] office  For scheduling needs, including cancellations and rescheduling, please call [phone].

## 2019-08-15 NOTE — Progress Notes (Signed)
Cardiology Office Note:    Date:  08/16/2019   ID:  DAIMIAN BOOTH, DOB [birth year], MRN [id number]  PCP:  Antony Contras, MD  Cardiologist:  Candee Furbish, MD  Electrophysiologist:  None   Referring MD: Antony Contras, MD     History of Present Illness:    Ronald Roy is a 49 y.o. male here for the evaluation of cardiac risk at the request of Dr. Moreen Fowler.   I seen previously in 2011 and perform cardiac catheterization at that time which was normal.  Reassuring.  He was going through a tough time in his life during that episode.  He showed some inferior changes on nuclear stress test which were likely false positive from diaphragmatic attenuation given his cardiac catheterization.  His EKG has shown some nonspecific ST-T wave changes, subtle T wave inversion noted in V2 currently.  He has mention that previously his EKGs have caused some alarm in the past.  In review of office visit from 07/12/2019 he mentioned that his father had an MI on New Year's Day and that his wife was diagnosed with breast cancer.  Is been a tough year.  He is interested in consulting with [place] to evaluate risk factors especially given his family history with his father.  He has essential hypertension as well as hyperlipidemia and is taking statin.  Occasionally has rare gout flares.  He has modified his diet such as eating less red meat.  Still struggles with weight.  Is a former smoker.  Takes Prilosec over-the-counter for GERD.  He is somewhat worried because the ER doctor stated that his father may have thought he had GERD but this could have been an anginal equivalent.  Past Medical History:  Diagnosis Date  . Allergic rhinitis, unspecified   . Allergy   . Depression   . ED (erectile dysfunction)   . Gastroesophageal reflux disease without esophagitis   . Gout, unspecified   . Hyperlipidemia   . Hypertension   . Insomnia   . Major depression, single episode   . Migraine, unspecified, not  intractable, without status migrainosus   . Mild episode of recurrent major depressive disorder ([place])   . Mixed hyperlipidemia   . Obesity   . OSA (obstructive sleep apnea)   . Situational stress   . Sleep apnea     No past surgical history on file.  Current Medications: Current Meds  Medication Sig  . allopurinol (ZYLOPRIM) 300 MG tablet Take 300 mg by mouth daily.  Marland Kitchen atorvastatin (LIPITOR) 40 MG tablet Take 40 mg by mouth daily.  . hydrochlorothiazide (HYDRODIURIL) 25 MG tablet Take 25 mg by mouth daily.  . nebivolol (BYSTOLIC) 5 MG tablet Take 5 mg by mouth daily.  Marland Kitchen omeprazole (PRILOSEC) 20 MG capsule Take 20 mg by mouth 2 (two) times daily before a meal.  . zolpidem (AMBIEN) 10 MG tablet Take 10 mg by mouth at bedtime as needed for sleep.     Allergies:   Patient has no known allergies.   Social History   Socioeconomic History  . Marital status: Married    Spouse name: Not on file  . Number of children: Not on file  . Years of education: Not on file  . Highest education level: Not on file  Occupational History  . Not on file  Tobacco Use  . Smoking status: Never Smoker  . Smokeless tobacco: Never Used  Substance and Sexual Activity  . Alcohol use: No    Alcohol/week: 0.0  standard drinks  . Drug use: No  . Sexual activity: Not on file  Other Topics Concern  . Not on file  Social History Narrative  . Not on file   Social Determinants of Health   Financial Resource Strain:   . Difficulty of Paying Living Expenses:   Food Insecurity:   . Worried About Charity fundraiser in the Last Year:   . Arboriculturist in the Last Year:   Transportation Needs:   . Film/video editor (Medical):   Marland Kitchen Lack of Transportation (Non-Medical):   Physical Activity:   . Days of Exercise per Week:   . Minutes of Exercise per Session:   Stress:   . Feeling of Stress :   Social Connections:   . Frequency of Communication with Friends and Family:   . Frequency of Social  Gatherings with Friends and Family:   . Attends Religious Services:   . Active Member of Clubs or Organizations:   . Attends Archivist Meetings:   Marland Kitchen Marital Status:      Family History: The patient's family history includes Cancer in his maternal grandfather, paternal grandfather, and paternal grandmother; Diabetes in his father; Hypertension in his father, maternal grandmother, mother, and sister.  ROS:   Please see the history of present illness.    No fevers chills nausea vomiting syncope bleeding all other systems reviewed and are negative.  EKGs/Labs/Other Studies Reviewed:    The following studies were reviewed today: 2011 cath - no CAD  EKG:  EKG is ordered today.  The ekg ordered today demonstrates SB 48 with nonspecific ST-T wave changes  Recent Labs: No results found for requested labs within last 8760 hours.  Recent Lipid Panel No results found for: CHOL, TRIG, HDL, CHOLHDL, VLDL, LDLCALC, LDLDIRECT  Physical Exam:    VS:  BP 130/90   Pulse (!) 48   Ht 6' (1.829 m)   Wt (!) 317 lb (143.8 kg)   SpO2 98%   BMI 42.99 kg/m     Wt Readings from Last 3 Encounters:  08/15/19 (!) 317 lb (143.8 kg)  07/03/15 (!) 330 lb 12.8 oz (150 kg)  03/18/15 (!) 320 lb (145.2 kg)     GEN:  Well nourished, well developed in no acute distress HEENT: Normal NECK: No JVD; No carotid bruits LYMPHATICS: No lymphadenopathy CARDIAC: Bradycardic regular no murmurs, rubs, gallops RESPIRATORY:  Clear to auscultation without rales, wheezing or rhonchi  ABDOMEN: Soft, non-tender, non-distended MUSCULOSKELETAL:  No edema; No deformity  SKIN: Warm and dry NEUROLOGIC:  Alert and oriented x 3 PSYCHIATRIC:  Normal affect   ASSESSMENT:    1. Atypical chest pain   2. Gastroesophageal reflux disease without esophagitis   3. Obstructive sleep apnea   4. Morbid obesity ([place])   5. Chest pain, unspecified type    PLAN:    In order of problems listed above:  Cardiovascular  risk with family history of MI, father -Currently his hypertension is being treated, hyperlipidemia is also being treated with statin.  He is currently a non-smoker, recently quit. -Beneficial to proceed with coronary CT with possible FFR analysis.  With his nonspecific ST-T wave changes, treatment for GERD which could possibly be atypical chest pain it makes sense to proceed.  If abnormal we may wish to change over to Crestor 40 mg a day, higher intensity statin with goal LDL less than 70. -His heart rate was 48 bpm on EKG supine with Bystolic.  Take  Bystolic in the morning prior to CT.  Should be reasonable.  Essential hypertension -Medications reviewed, no changes.  GERD/atypical chest pain -Omeprazole over-the-counter, checking coronary CT.  Obstructive sleep apnea -CPAP  Morbid obesity -Continue to encourage weight loss.  Doing a good job with walking -Fatty infiltration of the liver was noted on 11/12/2009 ultrasound of the abdomen.  Medication Adjustments/Labs and Tests Ordered: Current medicines are reviewed at length with the patient today.  Concerns regarding medicines are outlined above.  Orders Placed This Encounter  Procedures  . CT CORONARY MORPH W/CTA COR W/SCORE W/CA W/CM &/OR WO/CM  . CT CORONARY FRACTIONAL FLOW RESERVE DATA PREP  . CT CORONARY FRACTIONAL FLOW RESERVE FLUID ANALYSIS  . EKG 12-Lead   No orders of the defined types were placed in this encounter.   Patient Instructions  Medication Instructions:  The current medical regimen is effective;  continue present plan and medications.  *If you need a refill on your cardiac medications before your next appointment, please call your pharmacy*  Testing/Procedures: Your physician has requested that you have Coronary CT. Cardiac computed tomography (CT) is a painless test that uses an x-ray machine to take clear, detailed pictures of your heart.  Please follow instruction sheet as given.  Follow-Up: Follow up  as needed after the above testing.  Thank you for choosing [facility]!!    Your cardiac CT will be scheduled at:  [facility] [address] [place], [place] [address] [phone]   Please arrive at the [facility] main entrance of [facility] 30 minutes prior to test start time. Proceed to the [facility] Radiology Department (first floor) to check-in and test prep.  Please follow these instructions carefully (unless otherwise directed):  Hold all erectile dysfunction medications at least 3 days (72 hrs) prior to test.  On the Night Before the Test: . Be sure to Drink plenty of water. . Do not consume any caffeinated/decaffeinated beverages or chocolate 12 hours prior to your test. . Do not take any antihistamines 12 hours prior to your test.  On the Day of the Test: . Drink plenty of water. Do not drink any water within one hour of the test. . Do not eat any food 4 hours prior to the test. . You may take your regular medications prior to the test.  . Take your Bystolic this am. . HOLD Furosemide/Hydrochlorothiazide morning of the test.  After the Test: . Drink plenty of water. . After receiving IV contrast, you may experience a mild flushed feeling. This is normal. . On occasion, you may experience a mild rash up to 24 hours after the test. This is not dangerous. If this occurs, you can take Benadryl 25 mg and increase your fluid intake. . If you experience trouble breathing, this can be serious. If it is severe call 911 IMMEDIATELY. If it is mild, please call our office. . If you take any of these medications: Glipizide/Metformin, Avandament, Glucavance, please do not take 48 hours after completing test unless otherwise instructed.   Once we have confirmed authorization from your insurance company, we will call you to set up a date and time for your test.   For non-scheduling related questions, please contact the cardiac imaging nurse  navigator should you have any questions/concerns: Marchia Bond, RN Navigator Cardiac Imaging [facility] [phone] office  For scheduling needs, including cancellations and rescheduling, please call [phone].       Signed, Elta Guadeloupe  Marlou Porch, MD  08/16/2019 7:04 AM    [facility]

## 2019-09-12 ENCOUNTER — Telehealth (HOSPITAL_COMMUNITY): Payer: Self-pay | Admitting: Emergency Medicine

## 2019-09-12 NOTE — Telephone Encounter (Signed)
Reaching out to patient to offer assistance regarding upcoming cardiac imaging study; pt verbalizes understanding of appt date/time, parking situation and where to check in, pre-test NPO status and medications ordered, and verified current allergies; name and call back number provided for further questions should they arise Cybill Uriegas RN Navigator Cardiac Imaging [facility] [phone] office [phone] cell 

## 2019-09-13 ENCOUNTER — Ambulatory Visit (HOSPITAL_COMMUNITY)
Admission: RE | Admit: 2019-09-13 | Discharge: 2019-09-13 | Disposition: A | Discharge status: Home / Self Care | Payer: 59 | Source: Ambulatory Visit | Attending: Cardiology | Admitting: Cardiology

## 2019-09-13 ENCOUNTER — Other Ambulatory Visit: Payer: Self-pay

## 2019-09-13 ENCOUNTER — Encounter: Payer: 59 | Admitting: *Deleted

## 2019-09-13 DIAGNOSIS — R079 Chest pain, unspecified: Secondary | ICD-10-CM | POA: Insufficient documentation

## 2019-09-13 DIAGNOSIS — R0789 Other chest pain: Secondary | ICD-10-CM | POA: Diagnosis present

## 2019-09-13 DIAGNOSIS — Z006 Encounter for examination for normal comparison and control in clinical research program: Secondary | ICD-10-CM

## 2019-09-13 MED ORDER — IOHEXOL 350 MG/ML SOLN
100.0000 mL | Freq: Once | INTRAVENOUS | Status: AC | PRN
  Administered 2019-09-13: 100 mL via INTRAVENOUS

## 2019-09-13 MED ORDER — NITROGLYCERIN 0.4 MG SL SUBL
SUBLINGUAL_TABLET | SUBLINGUAL | Status: AC
  Filled 2019-09-13: qty 2

## 2019-09-13 MED ORDER — NITROGLYCERIN 0.4 MG SL SUBL
0.8000 mg | SUBLINGUAL_TABLET | Freq: Once | SUBLINGUAL | Status: AC
  Administered 2019-09-13: 17:00:00 0.8 mg via SUBLINGUAL

## 2019-09-14 NOTE — Research (Signed)
CADFEM Informed Consent                  Subject Name:   Ronald Roy   Subject met inclusion and exclusion criteria.  The informed consent form, study requirements and expectations were reviewed with the subject and questions and concerns were addressed prior to the signing of the consent form.  The subject verbalized understanding of the trial requirements.  The subject agreed to participate in the CADFEM trial and signed the informed consent.  The informed consent was obtained prior to performance of any protocol-specific procedures for the subject.  A copy of the signed informed consent was given to the subject and a copy was placed in the subject's medical record. This patient was consented by Tiara Woodard.   Kenya Chalmers, Research Assistant  09/13/2019  16:00 p.m. 

## 2021-02-19 ENCOUNTER — Ambulatory Visit
Admission: RE | Admit: 2021-02-19 | Discharge: 2021-02-19 | Disposition: A | Discharge status: Home / Self Care | Payer: 59 | Source: Ambulatory Visit | Attending: Physician Assistant | Admitting: Physician Assistant

## 2021-02-19 ENCOUNTER — Other Ambulatory Visit: Payer: Self-pay | Admitting: Physician Assistant

## 2021-02-19 DIAGNOSIS — R062 Wheezing: Secondary | ICD-10-CM

## 2021-04-28 ENCOUNTER — Other Ambulatory Visit: Payer: Self-pay

## 2021-04-28 ENCOUNTER — Encounter: Payer: Self-pay | Admitting: Allergy

## 2021-04-28 ENCOUNTER — Ambulatory Visit (INDEPENDENT_AMBULATORY_CARE_PROVIDER_SITE_OTHER): Payer: 59 | Admitting: Allergy

## 2021-04-28 VITALS — BP 140/72 | HR 64 | Temp 98.3°F | Resp 16 | Ht 73.5 in | Wt 330.0 lb

## 2021-04-28 DIAGNOSIS — J302 Other seasonal allergic rhinitis: Secondary | ICD-10-CM | POA: Diagnosis not present

## 2021-04-28 DIAGNOSIS — J4520 Mild intermittent asthma, uncomplicated: Secondary | ICD-10-CM

## 2021-04-28 DIAGNOSIS — L2089 Other atopic dermatitis: Secondary | ICD-10-CM | POA: Diagnosis not present

## 2021-04-28 DIAGNOSIS — J3089 Other allergic rhinitis: Secondary | ICD-10-CM

## 2021-04-28 MED ORDER — EPINEPHRINE 0.3 MG/0.3ML IJ SOAJ: 0.3000 mg | INTRAMUSCULAR | 1 refills | Status: DC | PRN

## 2021-04-28 MED ORDER — ALBUTEROL SULFATE HFA 108 (90 BASE) MCG/ACT IN AERS: 2.0000 | INHALATION_SPRAY | RESPIRATORY_TRACT | 2 refills | Status: DC | PRN

## 2021-04-28 NOTE — Assessment & Plan Note (Signed)
Stable.  Continue proper skin care.

## 2021-04-28 NOTE — Progress Notes (Signed)
New Patient Note  RE: Ronald Roy MRN: [id number] DOB: March 29, 1971 Date of Office Visit: 04/28/2021  Consult requested by: No ref. provider found Primary care provider: Antony Contras, MD  Chief Complaint: transfer of care.  History of Present Illness: I had the pleasure of seeing Ronald Roy for initial evaluation at the [facility] on 04/28/2021. He is a 50 y.o. male, who is self-referred here for the evaluation of transfer of care.  Patient used to get allergy injections at [facility].   He reports symptoms of PND, rhinorrhea, nasal congestion. Symptoms have been going on for many years. The symptoms are present  all year around with worsening in the spring and fall. Other triggers include exposure to cat - causes shortness of breath requiring albuterol and steroid injections. Anosmia: no. Headache: sometimes. He has used Xyzal and Flonase with fair improvement in symptoms. Sinus infections: 2 this year. Previous work up includes: 2019 skin testing was positive to trees, mold, dust mites, cat.. Started allergy injections in 2019 and last injection was over 3-4 months ago. Some localized reactions. The injections helped and wants to restart the injections at our office as it's closer to his home and work for now.  The 2 cats passed away and he has been doing better but family may be interested in getting another cat in the future.   Previous ENT evaluation: not recently, no prior sinus surgery History of nasal polyps: no. History of reflux: yes and takes PPI prn.  Assessment and Plan: Ronald Roy is a 50 y.o. male with: Seasonal and perennial allergic rhinitis Perennial rhinitis symptoms with worsening the spring and fall.  Main triggers include cats which also flare his asthma.  2019 skin testing was positive to trees, mold, dust mites and cat.  Patient was on allergy immunotherapy with good benefit and would like to continue. 1 dog at home. 2 cats  passed away. Start environmental control measures as below. Use over the counter antihistamines such as Zyrtec (cetirizine), Claritin (loratadine), Allegra (fexofenadine), or Xyzal (levocetirizine) daily as needed. May take twice a day during allergy flares. May switch antihistamines every few months. Use Flonase (fluticasone) nasal spray 1 spray per nostril twice a day as needed for nasal congestion.  Start allergy injections. Had a detailed discussion with patient/family that clinical history is suggestive of allergic rhinitis, and may benefit from allergy immunotherapy (AIT). Discussed in detail regarding the dosing, schedule, side effects (mild to moderate local allergic reaction and rarely systemic allergic reactions including anaphylaxis), and benefits (significant improvement in nasal symptoms, seasonal flares of asthma) of immunotherapy with the patient. There is significant time commitment involved with allergy shots, which includes weekly immunotherapy injections for first 9-12 months and then biweekly to monthly injections for 3-5 years. Consent was signed. I have prescribed epinephrine injectable and demonstrated proper use. For mild symptoms you can take over the counter antihistamines such as Benadryl and monitor symptoms closely. If symptoms worsen or if you have severe symptoms including breathing issues, throat closure, significant swelling, whole body hives, severe diarrhea and vomiting, lightheadedness then inject epinephrine and seek immediate medical care afterwards.Emergency action plan given.  Mild intermittent asthma without complication Asthma since his 5s which was quiescent up until exposure to 2 cats at home which required multiple courses of systemic steroids.  Since the cats have passed away no recent albuterol use. Today's spirometry showed some restriction most likely due to body habitus. May use albuterol rescue inhaler 2 puffs  or nebulizer every 4 to 6 hours as needed  for shortness of breath, chest tightness, coughing, and wheezing. Monitor frequency of use.   Other atopic dermatitis Stable. Continue proper skin care.  Return in about 6 months (around 10/27/2021).  Meds ordered this encounter  Medications   EPINEPHrine 0.3 mg/0.3 mL IJ SOAJ injection    Sig: Inject 0.3 mg into the muscle as needed for anaphylaxis.    Dispense:  1 each    Refill:  1    May dispense generic/Mylan/Teva brand.   albuterol (VENTOLIN HFA) 108 (90 Base) MCG/ACT inhaler    Sig: Inhale 2 puffs into the lungs every 4 (four) hours as needed for wheezing or shortness of breath (coughing fits).    Dispense:  8 g    Refill:  2    Lab Orders  No laboratory test(s) ordered today    Other allergy screening: Asthma: yes He reports symptoms of chest tightness, shortness of breath, coughing, wheezing, nocturnal awakenings for 20+ years. Current medications include albuterol prn which help. He reports not using aerochamber with inhalers. He tried the following inhalers: Advair. Main triggers are cat allergies. In the last month, frequency of symptoms: 0x/week. Frequency of nocturnal symptoms: 0x/month. Frequency of SABA use: 0x/week. Interference with physical activity: no. Sleep is undisturbed. In the last 12 months, emergency room visits/urgent care visits/doctor office visits or hospitalizations due to respiratory issues: no. In the last 12 months, oral steroids courses: no. Lifetime history of hospitalization for respiratory issues: no. Prior intubations: no. History of pneumonia: no. He was evaluated by allergist in the past. Smoking exposure: denies. Up to date with flu vaccine: yes. Up to date with COVID-19 vaccine: yes. Prior Covid-19 infection: no. Food allergy: no Medication allergy: no Hymenoptera allergy:  Yellow jackets caused large localized reactions.  Urticaria: no Eczema: yes Stable.  History of recurrent infections suggestive of immunodeficency:  no  Diagnostics: Spirometry:  Tracings reviewed. His effort: Good reproducible efforts. FVC: 3.90L FEV1: 3.15L, 76% predicted FEV1/FVC ratio: 81% Interpretation: Spirometry consistent with possible restrictive disease.  Please see scanned spirometry results for details.  Past Medical History: Patient Active Problem List   Diagnosis Date Noted   Seasonal and perennial allergic rhinitis 04/28/2021   Mild intermittent asthma without complication 24/40/1027   Other atopic dermatitis 04/28/2021   Mild episode of recurrent major depressive disorder ([place])    Past Medical History:  Diagnosis Date   Allergic rhinitis, unspecified    Allergy    Depression    ED (erectile dysfunction)    Gastroesophageal reflux disease without esophagitis    Gout, unspecified    Hyperlipidemia    Hypertension    Insomnia    Major depression, single episode    Migraine, unspecified, not intractable, without status migrainosus    Mild episode of recurrent major depressive disorder (HCC)    Mixed hyperlipidemia    Obesity    OSA (obstructive sleep apnea)    Situational stress    Sleep apnea    Past Surgical History: History reviewed. No pertinent surgical history. Medication List:  Current Outpatient Medications  Medication Sig Dispense Refill   albuterol (VENTOLIN HFA) 108 (90 Base) MCG/ACT inhaler Inhale 2 puffs into the lungs every 4 (four) hours as needed for wheezing or shortness of breath (coughing fits). 8 g 2   atorvastatin (LIPITOR) 40 MG tablet Take 40 mg by mouth daily.     EPINEPHrine 0.3 mg/0.3 mL IJ SOAJ injection Inject 0.3 mg into the muscle  as needed for anaphylaxis. 1 each 1   fluticasone (FLONASE) 50 MCG/ACT nasal spray Place into both nostrils daily.     hydrochlorothiazide (HYDRODIURIL) 25 MG tablet Take 25 mg by mouth daily.     levocetirizine (XYZAL) 5 MG tablet Take 5 mg by mouth every evening.     nebivolol (BYSTOLIC) 5 MG tablet Take 5 mg by mouth daily.     omeprazole  (PRILOSEC) 20 MG capsule Take 20 mg by mouth 2 (two) times daily before a meal.     zolpidem (AMBIEN) 10 MG tablet Take 10 mg by mouth at bedtime as needed for sleep.     No current facility-administered medications for this visit.   Allergies: No Known Allergies Social History: Social History   Socioeconomic History   Marital status: Married    Spouse name: Not on file   Number of children: Not on file   Years of education: Not on file   Highest education level: Not on file  Occupational History   Not on file  Tobacco Use   Smoking status: Never   Smokeless tobacco: Never  Substance and Sexual Activity   Alcohol use: No    Alcohol/week: 0.0 standard drinks   Drug use: No   Sexual activity: Not on file  Other Topics Concern   Not on file  Social History Narrative   Not on file   Social Determinants of Health   Financial Resource Strain: Not on file  Food Insecurity: Not on file  Transportation Needs: Not on file  Physical Activity: Not on file  Stress: Not on file  Social Connections: Not on file   Lives in a house. Smoking: denies Occupation: Art gallery manager HistoryFreight forwarder in the house: no Charity fundraiser in the family room: no Carpet in the bedroom: yes Heating: gas Cooling: central Pet: yes 1 dog  Family History: Family History  Problem Relation Age of Onset   Hypertension Mother    Diabetes Father    Hypertension Father    Hypertension Sister    Hypertension Maternal Grandmother    Cancer Maternal Grandfather    Cancer Paternal Grandmother    Cancer Paternal Grandfather    Problem                               Relation Asthma                                   no Eczema                                no Food allergy                          no Allergic rhino conjunctivitis     parents, daughter   Review of Systems  Constitutional:  Negative for appetite change, chills, fever and unexpected weight change.  HENT:   Positive for postnasal drip. Negative for congestion and rhinorrhea.   Eyes:  Negative for itching.  Respiratory:  Negative for cough, chest tightness, shortness of breath and wheezing.   Cardiovascular:  Negative for chest pain.  Gastrointestinal:  Negative for abdominal pain.  Genitourinary:  Negative for difficulty urinating.  Skin:  Negative for rash.  Allergic/Immunologic: Positive for environmental allergies.  Neurological:  Negative for headaches.   Objective: BP 140/72   Pulse 64   Temp 98.3 F (36.8 C) (Temporal)   Resp 16   Ht 6' 1.5" (1.867 m)   Wt (!) 330 lb (149.7 kg)   SpO2 96%   BMI 42.95 kg/m  Body mass index is 42.95 kg/m. Physical Exam Vitals and nursing note reviewed.  Constitutional:      Appearance: Normal appearance. He is well-developed. He is obese.  HENT:     Head: Normocephalic and atraumatic.     Right Ear: Tympanic membrane and external ear normal.     Left Ear: Tympanic membrane and external ear normal.     Nose: Nose normal.     Mouth/Throat:     Mouth: Mucous membranes are moist.     Pharynx: Oropharynx is clear.  Eyes:     Conjunctiva/sclera: Conjunctivae normal.  Cardiovascular:     Rate and Rhythm: Normal rate and regular rhythm.     Heart sounds: Normal heart sounds. No murmur heard.   No friction rub. No gallop.  Pulmonary:     Effort: Pulmonary effort is normal.     Breath sounds: Normal breath sounds. No wheezing, rhonchi or rales.  Musculoskeletal:     Cervical back: Neck supple.  Skin:    General: Skin is warm.     Findings: No rash.  Neurological:     Mental Status: He is alert and oriented to person, place, and time.  Psychiatric:        Behavior: Behavior normal.  The plan was reviewed with the patient/family, and all questions/concerned were addressed.  It was my pleasure to see Ronald Roy today and participate in his care. Please feel free to contact me with any questions or concerns.  Sincerely,  Rexene Alberts,  DO Allergy & Immunology  [facility] office: [place] office: [phone]

## 2021-04-28 NOTE — Patient Instructions (Signed)
Environmental allergies 2019 skin testing was positive to trees, mold, dust mites and cat.  Start environmental control measures as below. Use over the counter antihistamines such as Zyrtec (cetirizine), Claritin (loratadine), Allegra (fexofenadine), or Xyzal (levocetirizine) daily as needed. May take twice a day during allergy flares. May switch antihistamines every few months. Use Flonase (fluticasone) nasal spray 1 spray per nostril twice a day as needed for nasal congestion.   Start allergy injections. Had a detailed discussion with patient/family that clinical history is suggestive of allergic rhinitis, and may benefit from allergy immunotherapy (AIT). Discussed in detail regarding the dosing, schedule, side effects (mild to moderate local allergic reaction and rarely systemic allergic reactions including anaphylaxis), and benefits (significant improvement in nasal symptoms, seasonal flares of asthma) of immunotherapy with the patient. There is significant time commitment involved with allergy shots, which includes weekly immunotherapy injections for first 9-12 months and then biweekly to monthly injections for 3-5 years. Consent was signed. I have prescribed epinephrine injectable and demonstrated proper use. For mild symptoms you can take over the counter antihistamines such as Benadryl and monitor symptoms closely. If symptoms worsen or if you have severe symptoms including breathing issues, throat closure, significant swelling, whole body hives, severe diarrhea and vomiting, lightheadedness then inject epinephrine and seek immediate medical care afterwards.Emergency action plan given.  Asthma: May use albuterol rescue inhaler 2 puffs or nebulizer every 4 to 6 hours as needed for shortness of breath, chest tightness, coughing, and wheezing. Monitor frequency of use.   Follow up in 6 months or sooner if needed.   Make appointment for first injection for 3 weeks.   Reducing Pollen  Exposure Pollen seasons: trees (spring), grass (summer) and ragweed/weeds (fall). Keep windows closed in your home and car to lower pollen exposure.  Install air conditioning in the bedroom and throughout the house if possible.  Avoid going out in dry windy days - especially early morning. Pollen counts are highest between 5 - 10 AM and on dry, hot and windy days.  Save outside activities for late afternoon or after a heavy rain, when pollen levels are lower.  Avoid mowing of grass if you have grass pollen allergy. Be aware that pollen can also be transported indoors on people and pets.  Dry your clothes in an automatic dryer rather than hanging them outside where they might collect pollen.  Rinse hair and eyes before bedtime. Pet Allergen Avoidance: Contrary to popular opinion, there are no "hypoallergenic" breeds of dogs or cats. That is because people are not allergic to an animal's hair, but to an allergen found in the animal's saliva, dander (dead skin flakes) or urine. Pet allergy symptoms typically occur within minutes. For some people, symptoms can build up and become most severe 8 to 12 hours after contact with the animal. People with severe allergies can experience reactions in public places if dander has been transported on the pet owners' clothing. Keeping an animal outdoors is only a partial solution, since homes with pets in the yard still have higher concentrations of animal allergens. Before getting a pet, ask your allergist to determine if you are allergic to animals. If your pet is already considered part of your family, try to minimize contact and keep the pet out of the bedroom and other rooms where you spend a great deal of time. As with dust mites, vacuum carpets often or replace carpet with a hardwood floor, tile or linoleum. High-efficiency particulate air (HEPA) cleaners can reduce allergen levels over  time. While dander and saliva are the source of cat and dog allergens,  urine is the source of allergens from rabbits, hamsters, mice and [place] pigs; so ask a non-allergic family member to clean the animal's cage. If you have a pet allergy, talk to your allergist about the potential for allergy immunotherapy (allergy shots). This strategy can often provide long-term relief. Control of House Dust Mite Allergen Dust mite allergens are a common trigger of allergy and asthma symptoms. While they can be found throughout the house, these microscopic creatures thrive in warm, humid environments such as bedding, upholstered furniture and carpeting. Because so much time is spent in the bedroom, it is essential to reduce mite levels there.  Encase pillows, mattresses, and box springs in special allergen-proof fabric covers or airtight, zippered plastic covers.  Bedding should be washed weekly in hot water (130 F) and dried in a hot dryer. Allergen-proof covers are available for comforters and pillows that can't be regularly washed.  Wash the allergy-proof covers every few months. Minimize clutter in the bedroom. Keep pets out of the bedroom.  Keep humidity less than 50% by using a dehumidifier or air conditioning. You can buy a humidity measuring device called a hygrometer to monitor this.  If possible, replace carpets with hardwood, linoleum, or washable area rugs. If that's not possible, vacuum frequently with a vacuum that has a HEPA filter. Remove all upholstered furniture and non-washable window drapes from the bedroom. Remove all non-washable stuffed toys from the bedroom.  Wash stuffed toys weekly. Mold Control Mold and fungi can grow on a variety of surfaces provided certain temperature and moisture conditions exist.  Outdoor molds grow on plants, decaying vegetation and soil. The major outdoor mold, Alternaria and Cladosporium, are found in very high numbers during hot and dry conditions. Generally, a late summer - fall peak is seen for common outdoor fungal spores. Rain  will temporarily lower outdoor mold spore count, but counts rise rapidly when the rainy period ends. The most important indoor molds are Aspergillus and Penicillium. Dark, humid and poorly ventilated basements are ideal sites for mold growth. The next most common sites of mold growth are the bathroom and the kitchen. Outdoor (Seasonal) Mold Control Use air conditioning and keep windows closed. Avoid exposure to decaying vegetation. Avoid leaf raking. Avoid grain handling. Consider wearing a face mask if working in moldy areas.  Indoor (Perennial) Mold Control  Maintain humidity below 50%. Get rid of mold growth on hard surfaces with water, detergent and, if necessary, 5% bleach (do not mix with other cleaners). Then dry the area completely. If mold covers an area more than 10 square feet, consider hiring an indoor environmental professional. For clothing, washing with soap and water is best. If moldy items cannot be cleaned and dried, throw them away. Remove sources e.g. contaminated carpets. Repair and seal leaking roofs or pipes. Using dehumidifiers in damp basements may be helpful, but empty the water and clean units regularly to prevent mildew from forming. All rooms, especially basements, bathrooms and kitchens, require ventilation and cleaning to deter mold and mildew growth. Avoid carpeting on concrete or damp floors, and storing items in damp areas.

## 2021-04-28 NOTE — Assessment & Plan Note (Signed)
Perennial rhinitis symptoms with worsening the spring and fall.  Main triggers include cats which also flare his asthma.  2019 skin testing was positive to trees, mold, dust mites and cat.  Patient was on allergy immunotherapy with good benefit and would like to continue. 1 dog at home. 2 cats passed away.  Start environmental control measures as below.  Use over the counter antihistamines such as Zyrtec (cetirizine), Claritin (loratadine), Allegra (fexofenadine), or Xyzal (levocetirizine) daily as needed. May take twice a day during allergy flares. May switch antihistamines every few months. . Use Flonase (fluticasone) nasal spray 1 spray per nostril twice a day as needed for nasal congestion.   Start allergy injections.  Had a detailed discussion with patient/family that clinical history is suggestive of allergic rhinitis, and may benefit from allergy immunotherapy (AIT). Discussed in detail regarding the dosing, schedule, side effects (mild to moderate local allergic reaction and rarely systemic allergic reactions including anaphylaxis), and benefits (significant improvement in nasal symptoms, seasonal flares of asthma) of immunotherapy with the patient. There is significant time commitment involved with allergy shots, which includes weekly immunotherapy injections for first 9-12 months and then biweekly to monthly injections for 3-5 years. Consent was signed.  I have prescribed epinephrine injectable and demonstrated proper use. For mild symptoms you can take over the counter antihistamines such as Benadryl and monitor symptoms closely. If symptoms worsen or if you have severe symptoms including breathing issues, throat closure, significant swelling, whole body hives, severe diarrhea and vomiting, lightheadedness then inject epinephrine and seek immediate medical care afterwards.Emergency action plan given.

## 2021-04-28 NOTE — Assessment & Plan Note (Signed)
Asthma since his 110s which was quiescent up until exposure to 2 cats at home which required multiple courses of systemic steroids.  Since the cats have passed away no recent albuterol use.  Today's spirometry showed some restriction most likely due to body habitus. . May use albuterol rescue inhaler 2 puffs or nebulizer every 4 to 6 hours as needed for shortness of breath, chest tightness, coughing, and wheezing. Monitor frequency of use.

## 2021-04-29 DIAGNOSIS — J3089 Other allergic rhinitis: Secondary | ICD-10-CM | POA: Diagnosis not present

## 2021-04-29 NOTE — Progress Notes (Signed)
VIALS MADE. EXP 04-29-22

## 2021-04-29 NOTE — Progress Notes (Signed)
Aeroallergen Immunotherapy   Ordering Provider: Dr. Rexene Alberts   Patient Details  Name: Ronald Roy  MRN: [id number]  Date of Birth: 1970/09/17   Order 2 of 2   Vial Label: M-C   0.2 ml (Volume)  1:10 Concentration -- Aspergillus mix  0.2 ml (Volume)  1:10 Concentration -- Penicillium mix  0.2 ml (Volume)  1:10 Concentration -- Rhizopus oryzae  0.5 ml (Volume)  1:10 Concentration -- Cat Hair    1.1  ml Extract Subtotal  3.9  ml Diluent  5.0  ml Maintenance Total   Schedule:  B  Blue Vial (1:100,000): Schedule B (6 doses)  Yellow Vial (1:10,000): Schedule B (6 doses)  Green Vial (1:1,000): Schedule B (6 doses)  Red Vial (1:100): Schedule A (14 doses)   Special Instructions: 1-2 times per week build up, once he is on second red vial maintenance at 0.36ml then go to every 2 weeks for maintenance.

## 2021-04-29 NOTE — Progress Notes (Signed)
Aeroallergen Immunotherapy   Ordering Provider: Dr. Rexene Alberts   Patient Details  Name: Ronald Roy  MRN: [id number]  Date of Birth: [birth year]   Order 1 of 2   Vial Label: T-Dm   0.5 ml (Volume)  1:20 Concentration -- Eastern 10 Tree Mix (also Sweet Gum)  0.5 ml (Volume)   AU Concentration -- Mite Mix (DF 5,000 & DP 5,000)    1.0  ml Extract Subtotal  4.0  ml Diluent  5.0  ml Maintenance Total   Schedule:  B  Blue Vial (1:100,000): Schedule B (6 doses)  Yellow Vial (1:10,000): Schedule B (6 doses)  Green Vial (1:1,000): Schedule B (6 doses)  Red Vial (1:100): Schedule A (14 doses)   Special Instructions: 1-2 times per week build up, once he is on second red vial maintenance at 0.74ml then go to every 2 weeks for maintenance.

## 2021-04-30 DIAGNOSIS — J3081 Allergic rhinitis due to animal (cat) (dog) hair and dander: Secondary | ICD-10-CM | POA: Diagnosis not present

## 2021-05-19 ENCOUNTER — Ambulatory Visit: Payer: 59

## 2021-05-19 ENCOUNTER — Other Ambulatory Visit: Payer: Self-pay

## 2021-06-02 ENCOUNTER — Ambulatory Visit (INDEPENDENT_AMBULATORY_CARE_PROVIDER_SITE_OTHER): Payer: 59

## 2021-06-02 ENCOUNTER — Other Ambulatory Visit: Payer: Self-pay

## 2021-06-02 DIAGNOSIS — J309 Allergic rhinitis, unspecified: Secondary | ICD-10-CM

## 2021-06-02 NOTE — Progress Notes (Signed)
Immunotherapy   Patient Details  Name: MAXIMINO COZZOLINO MRN: [id number] Date of Birth: 1971/02/25  06/02/2021  Chanda Busing Villamor started injections for  allergies, blue vials, patient waited 30 minutes in the office without any reactions. Following schedule: B  Frequency:2 times per week Epi-Pen:Epi-Pen Available   Consent signed and patient instructions given.   Isabel Caprice 06/02/2021, 3:43 PM

## 2021-06-09 ENCOUNTER — Other Ambulatory Visit: Payer: Self-pay

## 2021-06-09 ENCOUNTER — Ambulatory Visit (INDEPENDENT_AMBULATORY_CARE_PROVIDER_SITE_OTHER): Payer: 59

## 2021-06-09 DIAGNOSIS — J309 Allergic rhinitis, unspecified: Secondary | ICD-10-CM | POA: Diagnosis not present

## 2021-06-11 ENCOUNTER — Other Ambulatory Visit: Payer: Self-pay

## 2021-06-11 ENCOUNTER — Ambulatory Visit (INDEPENDENT_AMBULATORY_CARE_PROVIDER_SITE_OTHER): Payer: 59

## 2021-06-11 DIAGNOSIS — J309 Allergic rhinitis, unspecified: Secondary | ICD-10-CM

## 2021-06-16 ENCOUNTER — Other Ambulatory Visit: Payer: Self-pay

## 2021-06-16 ENCOUNTER — Ambulatory Visit (INDEPENDENT_AMBULATORY_CARE_PROVIDER_SITE_OTHER): Payer: 59 | Admitting: *Deleted

## 2021-06-16 DIAGNOSIS — J309 Allergic rhinitis, unspecified: Secondary | ICD-10-CM | POA: Diagnosis not present

## 2021-06-18 ENCOUNTER — Encounter: Payer: Self-pay | Admitting: Internal Medicine

## 2021-07-07 ENCOUNTER — Ambulatory Visit (INDEPENDENT_AMBULATORY_CARE_PROVIDER_SITE_OTHER): Payer: 59

## 2021-07-07 ENCOUNTER — Other Ambulatory Visit: Payer: Self-pay

## 2021-07-07 DIAGNOSIS — J309 Allergic rhinitis, unspecified: Secondary | ICD-10-CM

## 2021-07-13 ENCOUNTER — Other Ambulatory Visit: Payer: Self-pay

## 2021-07-13 ENCOUNTER — Ambulatory Visit: Payer: 59 | Admitting: *Deleted

## 2021-07-13 VITALS — Ht 73.0 in | Wt 315.0 lb

## 2021-07-13 DIAGNOSIS — Z1211 Encounter for screening for malignant neoplasm of colon: Secondary | ICD-10-CM

## 2021-07-13 NOTE — Progress Notes (Signed)

## 2021-07-14 ENCOUNTER — Ambulatory Visit (INDEPENDENT_AMBULATORY_CARE_PROVIDER_SITE_OTHER): Payer: 59

## 2021-07-14 DIAGNOSIS — J309 Allergic rhinitis, unspecified: Secondary | ICD-10-CM

## 2021-07-21 ENCOUNTER — Other Ambulatory Visit: Payer: Self-pay | Admitting: Physician Assistant

## 2021-07-21 DIAGNOSIS — R310 Gross hematuria: Secondary | ICD-10-CM

## 2021-07-23 ENCOUNTER — Ambulatory Visit (INDEPENDENT_AMBULATORY_CARE_PROVIDER_SITE_OTHER): Payer: 59

## 2021-07-23 ENCOUNTER — Other Ambulatory Visit: Payer: Self-pay

## 2021-07-23 DIAGNOSIS — J309 Allergic rhinitis, unspecified: Secondary | ICD-10-CM | POA: Diagnosis not present

## 2021-07-27 ENCOUNTER — Encounter: Payer: 59 | Admitting: Gastroenterology

## 2021-07-28 ENCOUNTER — Other Ambulatory Visit: Payer: 59

## 2021-08-10 ENCOUNTER — Ambulatory Visit
Admission: RE | Admit: 2021-08-10 | Discharge: 2021-08-10 | Disposition: A | Discharge status: Home / Self Care | Payer: 59 | Source: Ambulatory Visit | Attending: Physician Assistant | Admitting: Physician Assistant

## 2021-08-10 DIAGNOSIS — R310 Gross hematuria: Secondary | ICD-10-CM

## 2021-08-10 MED ORDER — IOPAMIDOL (ISOVUE-300) INJECTION 61%
75.0000 mL | Freq: Once | INTRAVENOUS | Status: AC | PRN
Start: 2021-08-10 — End: 2021-08-10
  Administered 2021-08-10: 75 mL via INTRAVENOUS

## 2021-08-11 ENCOUNTER — Other Ambulatory Visit: Payer: Self-pay

## 2021-08-11 ENCOUNTER — Ambulatory Visit (INDEPENDENT_AMBULATORY_CARE_PROVIDER_SITE_OTHER): Payer: 59 | Admitting: *Deleted

## 2021-08-11 DIAGNOSIS — J309 Allergic rhinitis, unspecified: Secondary | ICD-10-CM | POA: Diagnosis not present

## 2021-08-18 ENCOUNTER — Ambulatory Visit (INDEPENDENT_AMBULATORY_CARE_PROVIDER_SITE_OTHER): Payer: 59

## 2021-08-18 ENCOUNTER — Other Ambulatory Visit: Payer: Self-pay

## 2021-08-18 DIAGNOSIS — J309 Allergic rhinitis, unspecified: Secondary | ICD-10-CM | POA: Diagnosis not present

## 2021-08-19 ENCOUNTER — Encounter: Payer: Self-pay | Admitting: Internal Medicine

## 2021-08-21 ENCOUNTER — Ambulatory Visit (AMBULATORY_SURGERY_CENTER): Payer: 59 | Admitting: Internal Medicine

## 2021-08-21 ENCOUNTER — Encounter: Payer: Self-pay | Admitting: Internal Medicine

## 2021-08-21 VITALS — BP 138/82 | HR 61 | Temp 96.6°F | Resp 16 | Ht 73.5 in | Wt 315.0 lb

## 2021-08-21 DIAGNOSIS — D128 Benign neoplasm of rectum: Secondary | ICD-10-CM

## 2021-08-21 DIAGNOSIS — D127 Benign neoplasm of rectosigmoid junction: Secondary | ICD-10-CM

## 2021-08-21 DIAGNOSIS — D125 Benign neoplasm of sigmoid colon: Secondary | ICD-10-CM

## 2021-08-21 DIAGNOSIS — Z86010 Personal history of colonic polyps: Secondary | ICD-10-CM

## 2021-08-21 DIAGNOSIS — Z1211 Encounter for screening for malignant neoplasm of colon: Secondary | ICD-10-CM | POA: Diagnosis not present

## 2021-08-21 DIAGNOSIS — D120 Benign neoplasm of cecum: Secondary | ICD-10-CM

## 2021-08-21 DIAGNOSIS — Z860101 Personal history of adenomatous and serrated colon polyps: Secondary | ICD-10-CM

## 2021-08-21 HISTORY — DX: Personal history of colonic polyps: Z86.010

## 2021-08-21 HISTORY — DX: Personal history of adenomatous and serrated colon polyps: Z86.0101

## 2021-08-21 MED ORDER — SODIUM CHLORIDE 0.9 % IV SOLN: 500.0000 mL | Freq: Once | INTRAVENOUS | Status: DC

## 2021-08-21 NOTE — Progress Notes (Signed)
[facility] History and Physical ? ? ?Primary Care Physician:  Antony Contras, MD ? ? ?Reason for Procedure:   Colon cancer screening ? ?Plan:    colonoscopy ? ? ? ? ?HPI: AIVEN KAMPE is a 51 y.o. male here for above issue ?Having some RUQ pain off/on since Abx after finger injury (router) in Feb ? ? ?CT 08/10/21 ?IMPRESSION: ?1. Two linear densities within the central aspect of the penis in ?the region of the penile urethra, 2.4 and 1.2 cm. These likely ?represent calcifications, however cannot exclude foreign bodies. No ?adjacent air or soft tissue inflammation. Recommend correlation for ?any history of foreign body. ?2. No renal stones or obstructive uropathy. No explanation for ?hematuria. ?3. Small simple bilateral renal cysts, needing no further follow-up. ?4. Colonic diverticulosis without diverticulitis. ?5. Hepatic steatosis. ?Past Medical History:  ?Diagnosis Date  ? Allergic rhinitis, unspecified   ? Allergy   ? Asthma   ? Depression   ? ED (erectile dysfunction)   ? Gastroesophageal reflux disease without esophagitis   ? Gout, unspecified   ? Hyperlipidemia   ? Hypertension   ? Insomnia   ? Major depression, single episode   ? Migraine, unspecified, not intractable, without status migrainosus   ? Mild episode of recurrent major depressive disorder ([place])   ? Mixed hyperlipidemia   ? Obesity   ? OSA (obstructive sleep apnea)   ? Situational stress   ? Sleep apnea   ? ? ?Past Surgical History:  ?Procedure Laterality Date  ? TONSILLECTOMY    ? ? ?Prior to Admission medications   ?Medication Sig Start Date End Date Taking? Authorizing Provider  ?allopurinol (ZYLOPRIM) 300 MG tablet    Yes [provider]  ?atorvastatin (LIPITOR) 40 MG tablet Take 40 mg by mouth daily.   Yes [provider]  ?fluticasone (FLONASE) 50 MCG/ACT nasal spray Place into both nostrils daily.   Yes [provider]  ?hydrochlorothiazide (HYDRODIURIL) 25 MG tablet Take 25 mg by mouth daily.  08/13/19  Yes [provider]  ?levocetirizine (XYZAL) 5 MG tablet Take 5 mg by mouth every evening.   Yes [provider]  ?nebivolol (BYSTOLIC) 5 MG tablet Take 5 mg by mouth daily.   Yes [provider]  ?omeprazole (PRILOSEC) 20 MG capsule Take 20 mg by mouth 2 (two) times daily before a meal.   Yes [provider]  ?SUMAtriptan (IMITREX) 50 MG tablet Take 1 tablet by mouth See admin instructions.   Yes [provider]  ?zolpidem (AMBIEN) 10 MG tablet Take 10 mg by mouth at bedtime as needed for sleep.   Yes [provider]  ?albuterol (VENTOLIN HFA) 108 (90 Base) MCG/ACT inhaler Inhale 2 puffs into the lungs every 4 (four) hours as needed for wheezing or shortness of breath (coughing fits). ?Patient not taking: Reported on 07/13/2021 04/28/21   Garnet Sierras, DO  ?colchicine 0.6 MG tablet TAKE 1 TABLET BY MOUTH ONCE A DAY AS NEEDED FOR GOUT FLARE UPS    [provider]  ?EPINEPHrine 0.3 mg/0.3 mL IJ SOAJ injection Inject 0.3 mg into the muscle as needed for anaphylaxis. ?Patient not taking: Reported on 07/13/2021 04/28/21   Garnet Sierras, DO  ? ? ?Current Outpatient Medications  ?Medication Sig Dispense Refill  ? allopurinol (ZYLOPRIM) 300 MG tablet     ? atorvastatin (LIPITOR) 40 MG tablet Take 40 mg by mouth daily.    ? fluticasone (FLONASE) 50 MCG/ACT nasal spray Place into both nostrils  daily.    ? hydrochlorothiazide (HYDRODIURIL) 25 MG tablet Take 25 mg by mouth daily.    ? levocetirizine (XYZAL) 5 MG tablet Take 5 mg by mouth every evening.    ? nebivolol (BYSTOLIC) 5 MG tablet Take 5 mg by mouth daily.    ? omeprazole (PRILOSEC) 20 MG capsule Take 20 mg by mouth 2 (two) times daily before a meal.    ? SUMAtriptan (IMITREX) 50 MG tablet Take 1 tablet by mouth See admin instructions.    ? zolpidem (AMBIEN) 10 MG tablet Take 10 mg by mouth at bedtime as needed for sleep.    ? albuterol (VENTOLIN HFA) 108 (90 Base) MCG/ACT inhaler Inhale 2 puffs into the  lungs every 4 (four) hours as needed for wheezing or shortness of breath (coughing fits). (Patient not taking: Reported on 07/13/2021) 8 g 2  ? colchicine 0.6 MG tablet TAKE 1 TABLET BY MOUTH ONCE A DAY AS NEEDED FOR GOUT FLARE UPS    ? EPINEPHrine 0.3 mg/0.3 mL IJ SOAJ injection Inject 0.3 mg into the muscle as needed for anaphylaxis. (Patient not taking: Reported on 07/13/2021) 1 each 1  ? ?Current Facility-Administered Medications  ?Medication Dose Route Frequency Provider Last Rate Last Admin  ? 0.9 %  sodium chloride infusion  500 mL Intravenous Once Gatha Mayer, MD      ? ? ?Allergies as of 08/21/2021 - Review Complete 07/13/2021  ?Allergen Reaction Noted  ? Amlodipine besylate Other (See Comments) 07/12/2021  ? ? ?Family History  ?Problem Relation Age of Onset  ? Colon polyps Mother   ? Hypertension Mother   ? Colon polyps Father   ? Diabetes Father   ? Hypertension Father   ? Hypertension Sister   ? Hypertension Maternal Grandmother   ? Cancer Maternal Grandfather   ? Cancer Paternal Grandmother   ? Cancer Paternal Grandfather   ? Colon cancer Neg Hx   ? Esophageal cancer Neg Hx   ? Stomach cancer Neg Hx   ? Rectal cancer Neg Hx   ? ? ?Social History  ? ?Socioeconomic History  ? Marital status: Married  ?  Spouse name: Not on file  ? Number of children: Not on file  ? Years of education: Not on file  ? Highest education level: Not on file  ?Occupational History  ? Not on file  ?Tobacco Use  ? Smoking status: Never  ? Smokeless tobacco: Never  ?Substance and Sexual Activity  ? Alcohol use: No  ?  Comment: rarely  ? Drug use: No  ? Sexual activity: Not on file  ?Other Topics Concern  ? Not on file  ?Social History Narrative  ? Not on file  ? ?Social Determinants of Health  ? ?Financial Resource Strain: Not on file  ?Food Insecurity: Not on file  ?Transportation Needs: Not on file  ?Physical Activity: Not on file  ?Stress: Not on file  ?Social Connections: Not on file  ?Intimate Partner Violence: Not on file   ? ? ?Review of Systems: ? ?All other review of systems negative except as mentioned in the HPI. ? ?Physical Exam: ?Vital signs ?BP 133/76   Pulse 69   Temp (!) 96.6 ?F (35.9 ?C) (Skin)   Ht 6' 1.5" (1.867 m)   Wt (!) 315 lb (142.9 kg)   SpO2 97%   BMI 41.00 kg/m?  ? ?General:   Alert,  Well-developed, well-nourished, pleasant and cooperative in NAD ?Lungs:  Clear throughout to auscultation.   ?Heart:  Regular rate and rhythm; no murmurs, clicks, rubs,  or gallops. ?Abdomen:  Soft, nontender and nondistended. Normal bowel sounds.   ?Neuro/Psych:  Alert and cooperative. Normal mood and affect. A and O x 3 ? ? ?'@Noma Quijas'$  Simonne Maffucci, MD, [facility] ?[facility] ?[phone] (pager) ?08/21/2021 8:31 AM@ ? ?

## 2021-08-21 NOTE — Progress Notes (Signed)
Pt's states no medical or surgical changes since previsit or office visit. 

## 2021-08-21 NOTE — Progress Notes (Signed)
To Pacu, VSS. Report to [email or web address] 

## 2021-08-21 NOTE — Patient Instructions (Addendum)
I found and removed 3 polyps - all look benign. ? ?You also have a condition called diverticulosis - common and not usually a problem. Please read the handout provided. ? ?I will let you know pathology results and when to have another routine colonoscopy by mail and/or My Chart. ? ?We will make an office visit appointment for you to see me re: right upper abdominal pain. ? ?I appreciate the opportunity to care for you. ?Ronald Mayer, MD, [facility] ? ?Handouts on polyps and diverticulosis given. ? ? ?YOU HAD AN ENDOSCOPIC PROCEDURE TODAY AT [facility]:   Refer to the procedure report that was given to you for any specific questions about what was found during the examination.  If the procedure report does not answer your questions, please call your gastroenterologist to clarify.  If you requested that your care partner not be given the details of your procedure findings, then the procedure report has been included in a sealed envelope for you to review at your convenience later. ? ?YOU SHOULD EXPECT: Some feelings of bloating in the abdomen. Passage of more gas than usual.  Walking can help get rid of the air that was put into your GI tract during the procedure and reduce the bloating. If you had a lower endoscopy (such as a colonoscopy or flexible sigmoidoscopy) you may notice spotting of blood in your stool or on the toilet paper. If you underwent a bowel prep for your procedure, you may not have a normal bowel movement for a few days. ? ?Please Note:  You might notice some irritation and congestion in your nose or some drainage.  This is from the oxygen used during your procedure.  There is no need for concern and it should clear up in a day or so. ? ?SYMPTOMS TO REPORT IMMEDIATELY: ? ?Following lower endoscopy (colonoscopy or flexible sigmoidoscopy): ? Excessive amounts of blood in the stool ? Significant tenderness or worsening of abdominal pains ? Swelling of the abdomen that is new,  acute ? Fever of 100?F or higher ?For urgent or emergent issues, a gastroenterologist can be reached at any hour by calling [phone]. ?Do not use MyChart messaging for urgent concerns.  ? ? ?DIET:  We do recommend a small meal at first, but then you may proceed to your regular diet.  Drink plenty of fluids but you should avoid alcoholic beverages for 24 hours. ? ?ACTIVITY:  You should plan to take it easy for the rest of today and you should NOT DRIVE or use heavy machinery until tomorrow (because of the sedation medicines used during the test).   ? ?FOLLOW UP: ?Our staff will call the number listed on your records 48-72 hours following your procedure to check on you and address any questions or concerns that you may have regarding the information given to you following your procedure. If we do not reach you, we will leave a message.  We will attempt to reach you two times.  During this call, we will ask if you have developed any symptoms of COVID 19. If you develop any symptoms (ie: fever, flu-like symptoms, shortness of breath, cough etc.) before then, please call [phone].  If you test positive for Covid 19 in the 2 weeks post procedure, please call and report this information to [place].   ? ?If any biopsies were taken you will be contacted by phone or by letter within the next 1-3 weeks.  Please call us at [phone] if  you have not heard about the biopsies in 3 weeks.  ? ? ?SIGNATURES/CONFIDENTIALITY: ?You and/or your care partner have signed paperwork which will be entered into your electronic medical record.  These signatures attest to the fact that that the information above on your After Visit Summary has been reviewed and is understood.  Full responsibility of the confidentiality of this discharge information lies with you and/or your care-partner.  ?

## 2021-08-21 NOTE — Op Note (Addendum)
[facility] ?Patient Name: Ronald Roy ?Procedure Date: 08/21/2021 8:36 AM ?MRN: [id number] ?Endoscopist: Gatha Mayer , MD ?Age: 51 ?Referring MD:  ?Date of Birth: Aug 23, 1970 ?Gender: Male ?Account #: [id number] ?Procedure:                Colonoscopy ?Medicines:                Monitored Anesthesia Care ?Procedure:                Pre-Anesthesia Assessment: ?                          - Prior to the procedure, a History and Physical  ?                          was performed, and patient medications and  ?                          allergies were reviewed. The patient's tolerance of  ?                          previous anesthesia was also reviewed. The risks  ?                          and benefits of the procedure and the sedation  ?                          options and risks were discussed with the patient.  ?                          All questions were answered, and informed consent  ?                          was obtained. Prior Anticoagulants: The patient has  ?                          taken no previous anticoagulant or antiplatelet  ?                          agents. ASA Grade Assessment: III - A patient with  ?                          severe systemic disease. After reviewing the risks  ?                          and benefits, the patient was deemed in  ?                          satisfactory condition to undergo the procedure. ?                          After obtaining informed consent, the colonoscope  ?                          was passed under direct vision. Throughout the  ?  procedure, the patient's blood pressure, pulse, and  ?                          oxygen saturations were monitored continuously. The  ?                          CF [id number] #[id number] was introduced through the anus  ?                          and advanced to the the cecum, identified by  ?                          appendiceal orifice and ileocecal valve. The  ?                          colonoscopy  was performed without difficulty. The  ?                          patient tolerated the procedure well. The quality  ?                          of the bowel preparation was good. The ileocecal  ?                          valve, appendiceal orifice, and rectum were  ?                          photographed. The bowel preparation used was  ?                          Miralax via split dose instruction. ?Scope In: 4:82:50 AM ?Scope Out: 9:02:44 AM ?Scope Withdrawal Time: 0 hours 14 minutes 19 seconds  ?Total Procedure Duration: 0 hours 16 minutes 26 seconds  ?Findings:                 The perianal and digital rectal examinations were  ?                          normal. Pertinent negatives include normal prostate  ?                          (size, shape, and consistency). ?                          Two sessile polyps were found in the rectum and  ?                          sigmoid colon. The polyps were 3 to 8 mm in size.  ?                          These polyps were removed with a cold snare.  ?                          Resection and retrieval were complete.  Verification  ?                          of patient identification for the specimen was  ?                          done. Estimated blood loss was minimal. ?                          A 1 mm polyp was found in the cecum. The polyp was  ?                          sessile. The polyp was removed with a cold biopsy  ?                          forceps. Resection and retrieval were complete.  ?                          Verification of patient identification for the  ?                          specimen was done. Estimated blood loss was minimal. ?                          Multiple diverticula were found in the sigmoid  ?                          colon. ?                          The exam was otherwise without abnormality on  ?                          direct and retroflexion views. ?Complications:            No immediate complications. ?Estimated Blood Loss:     Estimated  blood loss was minimal. ?Impression:               - Two 3 to 8 mm polyps in the rectum and in the  ?                          sigmoid colon, removed with a cold snare. Resected  ?                          and retrieved. ?                          - One 1 mm polyp in the cecum, removed with a cold  ?                          biopsy forceps. Resected and retrieved. ?                          - Diverticulosis in the sigmoid colon. ?                          -  The examination was otherwise normal on direct  ?                          and retroflexion views. ?Recommendation:           - Patient has a contact number available for  ?                          emergencies. The signs and symptoms of potential  ?                          delayed complications were discussed with the  ?                          patient. Return to normal activities tomorrow.  ?                          Written discharge instructions were provided to the  ?                          patient. ?                          - Resume previous diet. ?                          - Continue present medications. ?                          - Await pathology results. ?                          - Repeat colonoscopy is recommended. The  ?                          colonoscopy date will be determined after pathology  ?                          results from today's exam become available for  ?                          review. ?                          - Office visit re: RUQ pain - will be scheduled ?Gatha Mayer, MD ?08/21/2021 9:11:57 AM ?This report has been signed electronically. ?Addendum Number: 1   Addendum Date: 09/04/2021 4:14:43 PM ?     INDICATION: ?     Colon cancer screening ?Gatha Mayer, MD ?09/04/2021 4:14:58 PM ?This report has been signed electronically. ?

## 2021-08-21 NOTE — Progress Notes (Signed)
Called to room to assist during endoscopic procedure.  Patient ID and intended procedure confirmed with present staff. Received instructions for my participation in the procedure from the performing physician.  

## 2021-08-25 ENCOUNTER — Encounter: Payer: Self-pay | Admitting: Internal Medicine

## 2021-08-25 ENCOUNTER — Telehealth: Payer: Self-pay | Admitting: *Deleted

## 2021-08-25 NOTE — Telephone Encounter (Signed)
No answer for post procedure call back. Left VM. 

## 2021-08-26 ENCOUNTER — Encounter: Payer: Self-pay | Admitting: Internal Medicine

## 2021-08-26 ENCOUNTER — Ambulatory Visit (INDEPENDENT_AMBULATORY_CARE_PROVIDER_SITE_OTHER): Payer: 59 | Admitting: Internal Medicine

## 2021-08-26 VITALS — BP 142/82 | HR 85 | Ht 73.5 in | Wt 323.0 lb

## 2021-08-26 DIAGNOSIS — R1011 Right upper quadrant pain: Secondary | ICD-10-CM

## 2021-08-26 DIAGNOSIS — K760 Fatty (change of) liver, not elsewhere classified: Secondary | ICD-10-CM | POA: Diagnosis not present

## 2021-08-26 DIAGNOSIS — R12 Heartburn: Secondary | ICD-10-CM

## 2021-08-26 DIAGNOSIS — R1013 Epigastric pain: Secondary | ICD-10-CM | POA: Diagnosis not present

## 2021-08-26 DIAGNOSIS — E8881 Metabolic syndrome: Secondary | ICD-10-CM

## 2021-08-26 DIAGNOSIS — R1310 Dysphagia, unspecified: Secondary | ICD-10-CM

## 2021-08-26 DIAGNOSIS — E669 Obesity, unspecified: Secondary | ICD-10-CM

## 2021-08-26 NOTE — Progress Notes (Signed)
? ?Ronald Roy 51 y.o. [birth year] [id number] ? ?Assessment & Plan:  ? ?Encounter Diagnoses  ?Name Primary?  ? Abdominal pain, right upper quadrant Yes  ? Dyspepsia   ? Heartburn   ? NAFLD (nonalcoholic fatty liver disease)   ? Abdominal obesity and metabolic syndrome   ? Dysphagia, mild and rare   ? ? ?Therapeutic plan is to take his omeprazole 20 mg daily and return in 2 months.  We talked about endoscopy which there is a relative indication for.  The dysphagia is very mild and rare and has occurred over years so I doubt it has anything to do with malignancy.  He was not inclined to pursue an EGD at this time and I think that is fine and I did not push hard for that.  We may need to proceed with that if he fails PPI therapy for 2 months. ? ?Some of the right upper quadrant pain could be fatty liver disease its not clear to me.  Probably not but possible.  We reviewed how that was a sign of poor metabolic health and he is interested in eating differently so I have given him recommendations regarding reducing processed foods and some book recommendations which can be seen in the AVS.  Also some handouts about eating less processed food especially carbohydrates and more healthy fats and proteins.  In addition since he has heartburn and some of this may be GERD a GERD diet handout provided. ? ?Again he will return in 2 months.  At that point we will discuss more about healthier eating.  I explained that he should read the books and though I lean towards time restricted eating and intermittent fasting that is only 1 way to go about it but reduction of processed foods is very important. ? ?I appreciate the opportunity to care for this patient. ?CC: Antony Contras, MD ? ? ?Subjective:  ? ?Chief Complaint: Right upper quadrant pain fatty liver ? ?HPI ?Patient had a screening colonoscopy in August 21, 2021 and at that time he was complaining of some right upper quadrant pain and requested an office visit.  At the  colonoscopy he had an 8 mm adenoma, sigmoid diverticulosis and 2 other polyps that were not precancerous.  Routine repeat for 2030 recommended. ? ?He is describing a right upper quadrant pain that seem to come on when he was taking a lot of antibiotics as he says after finger injuries (router injury).  Radiated into the back some into the shoulder when he would press on his abdomen he would have belching.  He added some probiotics he saw primary care and Reglan was prescribed.  That was no help he stopped that.  He felt better somewhat he was working on probiotics and yogurt and other foods that he thought would be beneficial.  A week or so prior to the colonoscopy it worsened.  He had a CT scan as below. ? ?He does have a history of heartburn possible GERD years ago he saw Juanita Craver for that and a fissure.  He is describing some rare dysphagia once every other month or so where crackers or starchy soft foods may moves slowly and impact slightly.  He thinks he is eating too fast.  No aspiration symptoms.  He will use omeprazole intermittently a week or 2 at a time to control heartburn symptoms.  He drinks 2 cups of coffee a day and occasional tea in the afternoon he is gradually eliminating Diet Coke at lunch.  He is trying to eat better and his wife went through breast cancer she is now cooking more and trying to reduce processed foods in the diet.  When I questioned him about sugar he says "way too much".  He goes on to talk about having candy and snacks and things like that around he is trying to get off a high fructose corn syrup.  There is occasional ice cream but he feels like over time he had a heavy carbohydrate diet with processed foods.  However they are decreasing that in the house now and he might feel little bit better. ? ?He also goes on to state that he occasionally takes some Gas-X with benefit. ? ?He has never had an upper endoscopy. ? ? ?CT abdomen and pelvis with contrast  08/12/2021 ?IMPRESSION: ?1. Two linear densities within the central aspect of the penis in ?the region of the penile urethra, 2.4 and 1.2 cm. These likely ?represent calcifications, however cannot exclude foreign bodies. No ?adjacent air or soft tissue inflammation. Recommend correlation for ?any history of foreign body. ?2. No renal stones or obstructive uropathy. No explanation for ?hematuria. ?3. Small simple bilateral renal cysts, needing no further follow-up. ?4. Colonic diverticulosis without diverticulitis. ?5. Hepatic steatosis. ?  ?Aortic Atherosclerosis (ICD10-I70.0). ?  ?  ? ? ? ?Labs in March 2023 hemoglobin A1c 5.7 ?Total cholesterol 157 HDL 43, triglycerides 109 LDL 94 ?February 2023 albumin 4.4 bilirubin 0.6 alk phos 62 AST 23 ALT 41 September 2022 hemoglobin 15.2 white count 7.9 ? ?Allergies  ?Allergen Reactions  ? Amlodipine Besylate Other (See Comments)  ? ?Current Meds  ?Medication Sig  ? allopurinol (ZYLOPRIM) 300 MG tablet   ? atorvastatin (LIPITOR) 40 MG tablet Take 40 mg by mouth daily.  ? colchicine 0.6 MG tablet TAKE 1 TABLET BY MOUTH ONCE A DAY AS NEEDED FOR GOUT FLARE UPS  ? EPINEPHrine 0.3 mg/0.3 mL IJ SOAJ injection Inject 0.3 mg into the muscle as needed for anaphylaxis.  ? fluticasone (FLONASE) 50 MCG/ACT nasal spray Place into both nostrils daily.  ? hydrochlorothiazide (HYDRODIURIL) 25 MG tablet Take 25 mg by mouth daily.  ? levocetirizine (XYZAL) 5 MG tablet Take 5 mg by mouth every evening.  ? nebivolol (BYSTOLIC) 5 MG tablet Take 5 mg by mouth daily.  ? omeprazole (PRILOSEC) 20 MG capsule Take 20 mg by mouth 2 (two) times daily before a meal.  ? SUMAtriptan (IMITREX) 50 MG tablet Take 1 tablet by mouth See admin instructions.  ? zolpidem (AMBIEN) 10 MG tablet Take 10 mg by mouth at bedtime as needed for sleep.  ? ?Past Medical History:  ?Diagnosis Date  ? Allergic rhinitis, unspecified   ? Allergy   ? Asthma   ? Depression   ? ED (erectile dysfunction)   ? Gastroesophageal reflux  disease without esophagitis   ? Gout, unspecified   ? Hx of adenomatous polyp of colon 08/21/2021  ? 8 mm adenoma - recall 2030  ? Hyperlipidemia   ? Hypertension   ? Insomnia   ? Major depression, single episode   ? Migraine, unspecified, not intractable, without status migrainosus   ? Mild episode of recurrent major depressive disorder ([place])   ? Mixed hyperlipidemia   ? Obesity   ? OSA (obstructive sleep apnea)   ? Situational stress   ? Sleep apnea   ? ?Past Surgical History:  ?Procedure Laterality Date  ? COLONOSCOPY    ? TONSILLECTOMY    ? ?Social History  ? ?Social  History Narrative  ? 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  ? Alcohol occasional a few times a week from 1 beer at a time to several beers and maybe some scotch.  The several drinking episodes would be every other month or so.  ? Never smoker no drug use  ? 3 children  ? ?family history includes Cancer in his maternal grandfather, paternal grandfather, and paternal grandmother; Colon polyps in his father and mother; Diabetes in his father; Hypertension in his father, maternal grandmother, mother, and sister. ? ? ?Review of Systems ?See HPI ? ?Objective:  ? Physical Exam ?BP (!) 142/82   Pulse 85   Ht 6' 1.5" (1.867 m)   Wt (!) 323 lb (146.5 kg)   SpO2 97%   BMI 42.04 kg/m?  ?Obese wm NAD ?Lungs cta ?Cor NL S1S2 no rmg ?Abd obese soft and NT no HSM/mass ?Alert and oriented x 3 ? ?

## 2021-08-26 NOTE — Patient Instructions (Addendum)
Check out the book Stay off My Operating Table by Dr. Tracie Harrier - will tell you how to eat differently to get healthier ? ?Then check out The Obesity Code by Dr. Sharman Cheek ? ?Intermittent Fasting Diet Guide and Cookbook by Dr. Alberteen Spindle is another to consider. ? ? ?Take omeprazole daily as we discussed (in AM) ? ?I appreciate the opportunity to care for you. ?Gatha Mayer, MD, [facility] ? ? ?

## 2021-08-27 ENCOUNTER — Ambulatory Visit (INDEPENDENT_AMBULATORY_CARE_PROVIDER_SITE_OTHER): Payer: 59

## 2021-08-27 DIAGNOSIS — J309 Allergic rhinitis, unspecified: Secondary | ICD-10-CM | POA: Diagnosis not present

## 2021-09-08 ENCOUNTER — Ambulatory Visit (INDEPENDENT_AMBULATORY_CARE_PROVIDER_SITE_OTHER): Payer: 59

## 2021-09-08 DIAGNOSIS — J309 Allergic rhinitis, unspecified: Secondary | ICD-10-CM

## 2021-09-22 ENCOUNTER — Ambulatory Visit (INDEPENDENT_AMBULATORY_CARE_PROVIDER_SITE_OTHER): Payer: 59

## 2021-09-22 DIAGNOSIS — J309 Allergic rhinitis, unspecified: Secondary | ICD-10-CM | POA: Diagnosis not present

## 2021-10-06 ENCOUNTER — Ambulatory Visit (INDEPENDENT_AMBULATORY_CARE_PROVIDER_SITE_OTHER): Payer: 59 | Admitting: *Deleted

## 2021-10-06 DIAGNOSIS — J309 Allergic rhinitis, unspecified: Secondary | ICD-10-CM

## 2021-10-20 ENCOUNTER — Ambulatory Visit (INDEPENDENT_AMBULATORY_CARE_PROVIDER_SITE_OTHER): Payer: 59

## 2021-10-20 DIAGNOSIS — J309 Allergic rhinitis, unspecified: Secondary | ICD-10-CM | POA: Diagnosis not present

## 2021-10-22 ENCOUNTER — Ambulatory Visit (INDEPENDENT_AMBULATORY_CARE_PROVIDER_SITE_OTHER): Payer: 59 | Admitting: *Deleted

## 2021-10-22 DIAGNOSIS — J309 Allergic rhinitis, unspecified: Secondary | ICD-10-CM | POA: Diagnosis not present

## 2021-10-26 ENCOUNTER — Ambulatory Visit (INDEPENDENT_AMBULATORY_CARE_PROVIDER_SITE_OTHER): Payer: 59 | Admitting: Internal Medicine

## 2021-10-26 ENCOUNTER — Encounter: Payer: Self-pay | Admitting: Internal Medicine

## 2021-10-26 VITALS — BP 130/68 | HR 84 | Ht 73.0 in | Wt 333.0 lb

## 2021-10-26 DIAGNOSIS — K760 Fatty (change of) liver, not elsewhere classified: Secondary | ICD-10-CM

## 2021-10-26 DIAGNOSIS — R1011 Right upper quadrant pain: Secondary | ICD-10-CM

## 2021-10-26 DIAGNOSIS — R1013 Epigastric pain: Secondary | ICD-10-CM | POA: Diagnosis not present

## 2021-10-26 NOTE — Progress Notes (Signed)
Ronald Roy 51 y.o. 12-22-1970 [id number]  Assessment & Plan:   Encounter Diagnoses  Name Primary?   NAFLD (nonalcoholic fatty liver disease) Yes   Dyspepsia    Abdominal pain, right upper quadrant     He can try to come off the omeprazole to see if he does okay since he is eating differently.  We once again reviewed the philosophy of lower carbohydrate eating.  He has been using half-and-half in his coffee in the morning and technically that breaks of fast so I advised using something like heavy cream instead.  He is encouraged to continue to work on reducing carbohydrates in his diet and I have given him a couple of other references of websites he can look at and consider some of the online services from these physicians.  See the AVS.  He will return as needed at this point.   Subjective:   Chief Complaint: Follow-up of fatty liver and right upper quadrant pain  HPI 51 year old white man seen in April with right upper quadrant pain.  Past medical history significant for gout, metabolic syndrome and obesity, hypertension, and he had an 8 mm adenoma removed from the colon in April 2023.  When he was here he was complaining of some heartburn symptoms he was using omeprazole intermittently he had very mild chronic dysphagia and he has been on omeprazole 20 mg daily since that time and those symptoms are gone.  Right upper quadrant pain is gone as well.  He attributes his improvement overall, he thinks, to improved eating.  I had recommended trying to use low carbohydrate eating and to consider time restricted eating.  He is working on that though there have been some relapses if you well.  He says he lost weight but then regained it when he visited his mother and "fell off the wagon". Allergies  Allergen Reactions   Amlodipine Besylate Other (See Comments)   Current Meds  Medication Sig   allopurinol (ZYLOPRIM) 300 MG tablet    atorvastatin (LIPITOR) 40 MG tablet Take 40 mg  by mouth daily.   colchicine 0.6 MG tablet TAKE 1 TABLET BY MOUTH ONCE A DAY AS NEEDED FOR GOUT FLARE UPS   hydrochlorothiazide (HYDRODIURIL) 25 MG tablet Take 25 mg by mouth daily.   levocetirizine (XYZAL) 5 MG tablet Take 5 mg by mouth every evening.   nebivolol (BYSTOLIC) 5 MG tablet Take 5 mg by mouth daily.   omeprazole (PRILOSEC) 20 MG capsule Take 20 mg by mouth 2 (two) times daily before a meal.   SUMAtriptan (IMITREX) 50 MG tablet Take 1 tablet by mouth as needed.   Past Medical History:  Diagnosis Date   Allergic rhinitis, unspecified    Allergy    Asthma    Depression    ED (erectile dysfunction)    Gastroesophageal reflux disease without esophagitis    Gout, unspecified    Hx of adenomatous polyp of colon 08/21/2021   8 mm adenoma - recall 2030   Hyperlipidemia    Hypertension    Insomnia    Major depression, single episode    Migraine, unspecified, not intractable, without status migrainosus    Mild episode of recurrent major depressive disorder (HCC)    Mixed hyperlipidemia    Obesity    OSA (obstructive sleep apnea)    Situational stress    Sleep apnea    Past Surgical History:  Procedure Laterality Date   COLONOSCOPY     TONSILLECTOMY  Social History   Social History Narrative   He is a Librarian, academic at Museum/gallery curator, 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   Alcohol occasional a few times a week from 1 beer at a time to several beers and maybe some scotch.  The several drinking episodes would be every other month or so.   Never smoker no drug use   3 children   family history includes Cancer in his maternal grandfather, paternal grandfather, and paternal grandmother; Colon polyps in his father and mother; Diabetes in his father; Hypertension in his father, maternal grandmother, mother, and sister.   Review of Systems As per HPI  Objective:   Physical Exam BP 130/68   Pulse 84   Ht '6\' 1"'$  (1.854 m)   Wt (!) 333 lb  (151 kg)   BMI 43.93 kg/m    Total time on visit 21 minutes

## 2021-10-26 NOTE — Patient Instructions (Addendum)
Some other websites to check out are those of Dr. Rebecca Eaton (low carb), Dr. Sharman Cheek (fasting) and Dr. Enrigue Catena (keto) If you google you will find them and what they have to offer.   Keep up the good work - you are getting started on the changes you need to make!  Stop the omeprazole and you can just use it as needed.  I appreciate the opportunity to care for you. Gatha Mayer, MD, [facility]

## 2021-10-27 ENCOUNTER — Ambulatory Visit (INDEPENDENT_AMBULATORY_CARE_PROVIDER_SITE_OTHER): Payer: 59

## 2021-10-27 DIAGNOSIS — J309 Allergic rhinitis, unspecified: Secondary | ICD-10-CM | POA: Diagnosis not present

## 2021-11-02 NOTE — Progress Notes (Signed)
Follow Up Note  RE: Ronald Roy MRN: [id number] DOB: January 09, 1971 Date of Office Visit: 11/03/2021  Referring provider: Antony Contras, MD Primary care provider: Antony Contras, MD  Chief Complaint: Allergic Rhinitis  (Cats still cause issues, eye burning, - takes zyrtec daily and Flonase daily )  History of Present Illness: I had the pleasure of seeing Ronald Roy for a follow up visit at the [facility] on 11/03/2021. He is a 51 y.o. male, who is being followed for allergic rhinitis on AIT, asthma and atopic dermatitis. His previous allergy office visit was on 04/28/2021 with Dr. Maudie Mercury. Today is a regular follow up visit. He is accompanied today by his spouse who provided/contributed to the history.   Seasonal and perennial allergic rhinitis Started AIT in January 2023 with no localized reactions.  Did not notice any improvement in symptoms yet. Taking OTC antihistamines with some benefit. Used Flonase prn in the spring with good benefit.   1 dog at home but hoping to get another cat.   Mild intermittent asthma  Denies any SOB, coughing, wheezing, chest tightness, nocturnal awakenings, ER/urgent care visits or prednisone use since the last visit.  Assessment and Plan: Ronald Roy is a 51 y.o. male with: Seasonal and perennial allergic rhinitis Past history - Perennial rhinitis symptoms with worsening the spring and fall.  Main triggers include cats which also flare his asthma.  2019 skin testing was positive to trees, mold, dust mites and cat.  Patient was on allergy immunotherapy with good benefit and would like to continue. 1 dog at home. 2 cats passed away. Interim history - started AIT on 06/02/2021 (T-DM & M-C) with no issues. Wife wants to get a cat.  Continue environmental control measures. Use over the counter antihistamines such as Zyrtec (cetirizine), Claritin (loratadine), Allegra (fexofenadine), or Xyzal (levocetirizine) daily as needed. May take  twice a day during allergy flares. May switch antihistamines every few months. Use Flonase (fluticasone) nasal spray 1 spray per nostril twice a day as needed for nasal congestion.  Continue allergy injections - given today.  If you get a cat and notice worsening symptoms let us know.  Mild intermittent asthma without complication Past history - Asthma since his 62s which was quiescent up until exposure to 2 cats at home which required multiple courses of systemic steroids.  Since the cats have passed away no recent albuterol use. 2022 spirometry showed some restriction most likely due to body habitus. Interim history - asymptomatic and no albuterol use.  May use albuterol rescue inhaler 2 puffs or nebulizer every 4 to 6 hours as needed for shortness of breath, chest tightness, coughing, and wheezing. Monitor frequency of use.   Return in about 1 year (around 11/04/2022).  No orders of the defined types were placed in this encounter.  Lab Orders  No laboratory test(s) ordered today    Diagnostics: None.   Medication List:  Current Outpatient Medications  Medication Sig Dispense Refill   allopurinol (ZYLOPRIM) 300 MG tablet      atorvastatin (LIPITOR) 40 MG tablet Take 40 mg by mouth daily.     colchicine 0.6 MG tablet TAKE 1 TABLET BY MOUTH ONCE A DAY AS NEEDED FOR GOUT FLARE UPS     hydrochlorothiazide (HYDRODIURIL) 25 MG tablet Take 25 mg by mouth daily.     levocetirizine (XYZAL) 5 MG tablet Take 5 mg by mouth every evening.     SUMAtriptan (IMITREX) 50 MG tablet Take 1 tablet by  mouth as needed.     No current facility-administered medications for this visit.   Allergies: Allergies  Allergen Reactions   Amlodipine Besylate Other (See Comments)   I reviewed his past medical history, social history, family history, and environmental history and no significant changes have been reported from his previous visit.  Review of Systems  Constitutional:  Negative for appetite  change, chills, fever and unexpected weight change.  HENT:  Negative for congestion and rhinorrhea.   Eyes:  Negative for itching.  Respiratory:  Negative for cough, chest tightness, shortness of breath and wheezing.   Cardiovascular:  Negative for chest pain.  Gastrointestinal:  Negative for abdominal pain.  Genitourinary:  Negative for difficulty urinating.  Skin:  Negative for rash.  Allergic/Immunologic: Positive for environmental allergies.  Neurological:  Negative for headaches.    Objective: BP 128/84   Pulse (!) 56   Temp 98.2 F (36.8 C)   Resp 16   Ht 6' 0.05" (1.83 m)   Wt (!) 325 lb 12.8 oz (147.8 kg)   SpO2 96%   BMI 44.13 kg/m  Body mass index is 44.13 kg/m. Physical Exam Vitals and nursing note reviewed.  Constitutional:      Appearance: Normal appearance. He is well-developed. He is obese.  HENT:     Head: Normocephalic and atraumatic.     Right Ear: Tympanic membrane and external ear normal.     Left Ear: Tympanic membrane and external ear normal.     Nose: Nose normal.     Mouth/Throat:     Mouth: Mucous membranes are moist.     Pharynx: Oropharynx is clear.  Eyes:     Conjunctiva/sclera: Conjunctivae normal.  Cardiovascular:     Rate and Rhythm: Normal rate and regular rhythm.     Heart sounds: Normal heart sounds. No murmur heard.    No friction rub. No gallop.  Pulmonary:     Effort: Pulmonary effort is normal.     Breath sounds: Normal breath sounds. No wheezing, rhonchi or rales.  Musculoskeletal:     Cervical back: Neck supple.  Skin:    General: Skin is warm.     Findings: No rash.  Neurological:     Mental Status: He is alert and oriented to person, place, and time.  Psychiatric:        Behavior: Behavior normal.    Previous notes and tests were reviewed. The plan was reviewed with the patient/family, and all questions/concerned were addressed.  It was my pleasure to see Ronald Roy today and participate in his care. Please feel free  to contact me with any questions or concerns.  Sincerely,  Rexene Alberts, DO Allergy & Immunology  [facility] office: [place] office: [phone]

## 2021-11-03 ENCOUNTER — Ambulatory Visit (INDEPENDENT_AMBULATORY_CARE_PROVIDER_SITE_OTHER): Payer: 59 | Admitting: Allergy

## 2021-11-03 ENCOUNTER — Encounter: Payer: Self-pay | Admitting: Allergy

## 2021-11-03 ENCOUNTER — Other Ambulatory Visit: Payer: Self-pay

## 2021-11-03 VITALS — BP 128/84 | HR 56 | Temp 98.2°F | Resp 16 | Ht 72.05 in | Wt 325.8 lb

## 2021-11-03 DIAGNOSIS — J4520 Mild intermittent asthma, uncomplicated: Secondary | ICD-10-CM | POA: Diagnosis not present

## 2021-11-03 DIAGNOSIS — J309 Allergic rhinitis, unspecified: Secondary | ICD-10-CM

## 2021-11-03 DIAGNOSIS — L2089 Other atopic dermatitis: Secondary | ICD-10-CM

## 2021-11-03 DIAGNOSIS — J3089 Other allergic rhinitis: Secondary | ICD-10-CM

## 2021-11-03 NOTE — Assessment & Plan Note (Addendum)
Past history - Perennial rhinitis symptoms with worsening the spring and fall.  Main triggers include cats which also flare his asthma.  2019 skin testing was positive to trees, mold, dust mites and cat.  Patient was on allergy immunotherapy with good benefit and would like to continue. 1 dog at home. 2 cats passed away. Interim history - started AIT on 06/02/2021 (T-DM & M-C) with no issues. Wife wants to get a cat.   Continue environmental control measures.  Use over the counter antihistamines such as Zyrtec (cetirizine), Claritin (loratadine), Allegra (fexofenadine), or Xyzal (levocetirizine) daily as needed. May take twice a day during allergy flares. May switch antihistamines every few months. . Use Flonase (fluticasone) nasal spray 1 spray per nostril twice a day as needed for nasal congestion.  . Continue allergy injections - given today.  . If you get a cat and notice worsening symptoms let us know.

## 2021-11-03 NOTE — Assessment & Plan Note (Signed)
Past history - Asthma since his 55s which was quiescent up until exposure to 2 cats at home which required multiple courses of systemic steroids.  Since the cats have passed away no recent albuterol use. 2022 spirometry showed some restriction most likely due to body habitus. Interim history - asymptomatic and no albuterol use.  . May use albuterol rescue inhaler 2 puffs or nebulizer every 4 to 6 hours as needed for shortness of breath, chest tightness, coughing, and wheezing. Monitor frequency of use.

## 2021-11-03 NOTE — Patient Instructions (Addendum)
Environmental allergies 2019 skin testing was positive to trees, mold, dust mites and cat.  Continue environmental control measures. Use over the counter antihistamines such as Zyrtec (cetirizine), Claritin (loratadine), Allegra (fexofenadine), or Xyzal (levocetirizine) daily as needed. May take twice a day during allergy flares. May switch antihistamines every few months. Use Flonase (fluticasone) nasal spray 1 spray per nostril twice a day as needed for nasal congestion.  Continue allergy injections - given today.  If you get a cat and notice worsening symptoms let us know.  Asthma: May use albuterol rescue inhaler 2 puffs or nebulizer every 4 to 6 hours as needed for shortness of breath, chest tightness, coughing, and wheezing. Monitor frequency of use.   Follow up in 12 months or sooner if needed.

## 2021-11-12 ENCOUNTER — Ambulatory Visit (INDEPENDENT_AMBULATORY_CARE_PROVIDER_SITE_OTHER): Payer: 59

## 2021-11-12 DIAGNOSIS — J309 Allergic rhinitis, unspecified: Secondary | ICD-10-CM

## 2021-11-17 ENCOUNTER — Ambulatory Visit (INDEPENDENT_AMBULATORY_CARE_PROVIDER_SITE_OTHER): Payer: 59

## 2021-11-17 DIAGNOSIS — J309 Allergic rhinitis, unspecified: Secondary | ICD-10-CM

## 2021-12-01 ENCOUNTER — Ambulatory Visit (INDEPENDENT_AMBULATORY_CARE_PROVIDER_SITE_OTHER): Payer: 59

## 2021-12-01 DIAGNOSIS — J309 Allergic rhinitis, unspecified: Secondary | ICD-10-CM

## 2021-12-08 ENCOUNTER — Ambulatory Visit (INDEPENDENT_AMBULATORY_CARE_PROVIDER_SITE_OTHER): Payer: 59

## 2021-12-08 DIAGNOSIS — J309 Allergic rhinitis, unspecified: Secondary | ICD-10-CM

## 2022-02-04 ENCOUNTER — Ambulatory Visit (INDEPENDENT_AMBULATORY_CARE_PROVIDER_SITE_OTHER): Payer: 59

## 2022-02-04 DIAGNOSIS — J309 Allergic rhinitis, unspecified: Secondary | ICD-10-CM | POA: Diagnosis not present

## 2022-02-09 ENCOUNTER — Ambulatory Visit (INDEPENDENT_AMBULATORY_CARE_PROVIDER_SITE_OTHER): Payer: 59

## 2022-02-09 DIAGNOSIS — J309 Allergic rhinitis, unspecified: Secondary | ICD-10-CM | POA: Diagnosis not present

## 2022-02-16 ENCOUNTER — Ambulatory Visit (INDEPENDENT_AMBULATORY_CARE_PROVIDER_SITE_OTHER): Payer: 59

## 2022-02-16 DIAGNOSIS — J309 Allergic rhinitis, unspecified: Secondary | ICD-10-CM | POA: Diagnosis not present

## 2022-03-02 ENCOUNTER — Ambulatory Visit (INDEPENDENT_AMBULATORY_CARE_PROVIDER_SITE_OTHER): Payer: 59

## 2022-03-02 DIAGNOSIS — J309 Allergic rhinitis, unspecified: Secondary | ICD-10-CM

## 2022-03-11 ENCOUNTER — Ambulatory Visit (INDEPENDENT_AMBULATORY_CARE_PROVIDER_SITE_OTHER): Payer: 59

## 2022-03-11 DIAGNOSIS — J309 Allergic rhinitis, unspecified: Secondary | ICD-10-CM | POA: Diagnosis not present

## 2022-03-23 ENCOUNTER — Ambulatory Visit (INDEPENDENT_AMBULATORY_CARE_PROVIDER_SITE_OTHER): Payer: 59

## 2022-03-23 DIAGNOSIS — J309 Allergic rhinitis, unspecified: Secondary | ICD-10-CM | POA: Diagnosis not present

## 2022-04-27 ENCOUNTER — Ambulatory Visit: Payer: 59

## 2022-05-03 DIAGNOSIS — J3089 Other allergic rhinitis: Secondary | ICD-10-CM

## 2022-05-03 NOTE — Progress Notes (Signed)
VIALS EXP 05-04-23

## 2022-06-08 ENCOUNTER — Ambulatory Visit (INDEPENDENT_AMBULATORY_CARE_PROVIDER_SITE_OTHER): Payer: 59

## 2022-06-08 DIAGNOSIS — J309 Allergic rhinitis, unspecified: Secondary | ICD-10-CM | POA: Diagnosis not present

## 2022-06-24 ENCOUNTER — Ambulatory Visit (INDEPENDENT_AMBULATORY_CARE_PROVIDER_SITE_OTHER): Payer: 59

## 2022-06-24 DIAGNOSIS — J309 Allergic rhinitis, unspecified: Secondary | ICD-10-CM

## 2022-06-29 ENCOUNTER — Ambulatory Visit (INDEPENDENT_AMBULATORY_CARE_PROVIDER_SITE_OTHER): Payer: 59

## 2022-06-29 DIAGNOSIS — J309 Allergic rhinitis, unspecified: Secondary | ICD-10-CM | POA: Diagnosis not present

## 2022-07-08 ENCOUNTER — Ambulatory Visit (INDEPENDENT_AMBULATORY_CARE_PROVIDER_SITE_OTHER): Payer: 59

## 2022-07-08 DIAGNOSIS — J309 Allergic rhinitis, unspecified: Secondary | ICD-10-CM

## 2022-07-13 ENCOUNTER — Ambulatory Visit (INDEPENDENT_AMBULATORY_CARE_PROVIDER_SITE_OTHER): Payer: 59

## 2022-07-13 DIAGNOSIS — J309 Allergic rhinitis, unspecified: Secondary | ICD-10-CM

## 2022-07-22 ENCOUNTER — Ambulatory Visit (INDEPENDENT_AMBULATORY_CARE_PROVIDER_SITE_OTHER): Payer: 59

## 2022-07-22 DIAGNOSIS — J309 Allergic rhinitis, unspecified: Secondary | ICD-10-CM

## 2022-07-27 ENCOUNTER — Ambulatory Visit (INDEPENDENT_AMBULATORY_CARE_PROVIDER_SITE_OTHER): Payer: 59

## 2022-07-27 DIAGNOSIS — J309 Allergic rhinitis, unspecified: Secondary | ICD-10-CM

## 2022-08-03 ENCOUNTER — Ambulatory Visit (INDEPENDENT_AMBULATORY_CARE_PROVIDER_SITE_OTHER): Payer: 59

## 2022-08-03 DIAGNOSIS — J309 Allergic rhinitis, unspecified: Secondary | ICD-10-CM | POA: Diagnosis not present

## 2022-08-12 ENCOUNTER — Ambulatory Visit (INDEPENDENT_AMBULATORY_CARE_PROVIDER_SITE_OTHER): Payer: 59

## 2022-08-12 DIAGNOSIS — J309 Allergic rhinitis, unspecified: Secondary | ICD-10-CM | POA: Diagnosis not present

## 2022-08-19 ENCOUNTER — Ambulatory Visit (INDEPENDENT_AMBULATORY_CARE_PROVIDER_SITE_OTHER): Payer: 59

## 2022-08-19 DIAGNOSIS — J309 Allergic rhinitis, unspecified: Secondary | ICD-10-CM

## 2022-08-31 ENCOUNTER — Ambulatory Visit (INDEPENDENT_AMBULATORY_CARE_PROVIDER_SITE_OTHER): Payer: 59

## 2022-08-31 DIAGNOSIS — J309 Allergic rhinitis, unspecified: Secondary | ICD-10-CM

## 2022-09-07 ENCOUNTER — Ambulatory Visit (INDEPENDENT_AMBULATORY_CARE_PROVIDER_SITE_OTHER): Payer: 59

## 2022-09-07 DIAGNOSIS — J309 Allergic rhinitis, unspecified: Secondary | ICD-10-CM

## 2022-09-23 ENCOUNTER — Ambulatory Visit (INDEPENDENT_AMBULATORY_CARE_PROVIDER_SITE_OTHER): Payer: 59

## 2022-09-23 DIAGNOSIS — J309 Allergic rhinitis, unspecified: Secondary | ICD-10-CM

## 2022-09-28 ENCOUNTER — Ambulatory Visit (INDEPENDENT_AMBULATORY_CARE_PROVIDER_SITE_OTHER): Payer: 59

## 2022-09-28 DIAGNOSIS — J309 Allergic rhinitis, unspecified: Secondary | ICD-10-CM | POA: Diagnosis not present

## 2022-10-05 ENCOUNTER — Ambulatory Visit (INDEPENDENT_AMBULATORY_CARE_PROVIDER_SITE_OTHER): Payer: 59

## 2022-10-05 DIAGNOSIS — J309 Allergic rhinitis, unspecified: Secondary | ICD-10-CM | POA: Diagnosis not present

## 2022-10-07 ENCOUNTER — Other Ambulatory Visit: Payer: Self-pay | Admitting: Student

## 2022-10-07 DIAGNOSIS — M5416 Radiculopathy, lumbar region: Secondary | ICD-10-CM

## 2022-10-07 DIAGNOSIS — M5412 Radiculopathy, cervical region: Secondary | ICD-10-CM

## 2022-10-19 ENCOUNTER — Ambulatory Visit (INDEPENDENT_AMBULATORY_CARE_PROVIDER_SITE_OTHER): Payer: 59

## 2022-10-19 DIAGNOSIS — J309 Allergic rhinitis, unspecified: Secondary | ICD-10-CM | POA: Diagnosis not present

## 2022-10-26 ENCOUNTER — Ambulatory Visit (INDEPENDENT_AMBULATORY_CARE_PROVIDER_SITE_OTHER): Payer: 59

## 2022-10-26 DIAGNOSIS — J309 Allergic rhinitis, unspecified: Secondary | ICD-10-CM | POA: Diagnosis not present

## 2022-11-02 ENCOUNTER — Ambulatory Visit (INDEPENDENT_AMBULATORY_CARE_PROVIDER_SITE_OTHER): Payer: 59

## 2022-11-02 DIAGNOSIS — J309 Allergic rhinitis, unspecified: Secondary | ICD-10-CM | POA: Diagnosis not present

## 2022-11-04 ENCOUNTER — Ambulatory Visit (INDEPENDENT_AMBULATORY_CARE_PROVIDER_SITE_OTHER): Payer: 59

## 2022-11-04 DIAGNOSIS — J309 Allergic rhinitis, unspecified: Secondary | ICD-10-CM

## 2022-11-08 NOTE — Progress Notes (Unsigned)
Immunotherapy   Patient Details  Name: Ronald Roy MRN: [id number] Date of Birth: 10-01-1970  11/08/2022  [name] came in for immunotherapy only    [name] 11/08/2022, 12:18 PM

## 2022-11-09 ENCOUNTER — Ambulatory Visit (INDEPENDENT_AMBULATORY_CARE_PROVIDER_SITE_OTHER): Payer: 59

## 2022-11-09 DIAGNOSIS — J309 Allergic rhinitis, unspecified: Secondary | ICD-10-CM | POA: Diagnosis not present

## 2022-11-23 ENCOUNTER — Other Ambulatory Visit: Payer: 59

## 2022-11-23 ENCOUNTER — Ambulatory Visit (INDEPENDENT_AMBULATORY_CARE_PROVIDER_SITE_OTHER): Payer: 59

## 2022-11-23 DIAGNOSIS — J309 Allergic rhinitis, unspecified: Secondary | ICD-10-CM

## 2022-11-28 ENCOUNTER — Ambulatory Visit
Admission: RE | Admit: 2022-11-28 | Discharge: 2022-11-28 | Disposition: A | Discharge status: Home / Self Care | Payer: 59 | Source: Ambulatory Visit | Attending: Student | Admitting: Student

## 2022-11-28 DIAGNOSIS — M5416 Radiculopathy, lumbar region: Secondary | ICD-10-CM

## 2022-11-28 DIAGNOSIS — M5412 Radiculopathy, cervical region: Secondary | ICD-10-CM

## 2022-12-02 ENCOUNTER — Ambulatory Visit (INDEPENDENT_AMBULATORY_CARE_PROVIDER_SITE_OTHER): Payer: 59

## 2022-12-02 DIAGNOSIS — J309 Allergic rhinitis, unspecified: Secondary | ICD-10-CM | POA: Diagnosis not present

## 2022-12-09 ENCOUNTER — Ambulatory Visit (INDEPENDENT_AMBULATORY_CARE_PROVIDER_SITE_OTHER): Payer: 59

## 2022-12-09 DIAGNOSIS — J309 Allergic rhinitis, unspecified: Secondary | ICD-10-CM

## 2022-12-23 ENCOUNTER — Ambulatory Visit (INDEPENDENT_AMBULATORY_CARE_PROVIDER_SITE_OTHER): Payer: 59

## 2022-12-23 DIAGNOSIS — J309 Allergic rhinitis, unspecified: Secondary | ICD-10-CM | POA: Diagnosis not present

## 2022-12-30 ENCOUNTER — Ambulatory Visit (INDEPENDENT_AMBULATORY_CARE_PROVIDER_SITE_OTHER): Payer: 59

## 2022-12-30 DIAGNOSIS — J309 Allergic rhinitis, unspecified: Secondary | ICD-10-CM

## 2023-01-06 ENCOUNTER — Ambulatory Visit (INDEPENDENT_AMBULATORY_CARE_PROVIDER_SITE_OTHER): Payer: 59

## 2023-01-06 DIAGNOSIS — J309 Allergic rhinitis, unspecified: Secondary | ICD-10-CM | POA: Diagnosis not present

## 2023-01-13 ENCOUNTER — Ambulatory Visit (INDEPENDENT_AMBULATORY_CARE_PROVIDER_SITE_OTHER): Payer: 59

## 2023-01-13 DIAGNOSIS — J309 Allergic rhinitis, unspecified: Secondary | ICD-10-CM | POA: Diagnosis not present

## 2023-01-20 ENCOUNTER — Ambulatory Visit (INDEPENDENT_AMBULATORY_CARE_PROVIDER_SITE_OTHER): Payer: 59

## 2023-01-20 DIAGNOSIS — J309 Allergic rhinitis, unspecified: Secondary | ICD-10-CM | POA: Diagnosis not present

## 2023-01-25 ENCOUNTER — Ambulatory Visit (INDEPENDENT_AMBULATORY_CARE_PROVIDER_SITE_OTHER): Payer: 59 | Admitting: *Deleted

## 2023-01-25 DIAGNOSIS — J309 Allergic rhinitis, unspecified: Secondary | ICD-10-CM | POA: Diagnosis not present

## 2023-02-03 ENCOUNTER — Ambulatory Visit (INDEPENDENT_AMBULATORY_CARE_PROVIDER_SITE_OTHER): Payer: 59

## 2023-02-03 DIAGNOSIS — J309 Allergic rhinitis, unspecified: Secondary | ICD-10-CM | POA: Diagnosis not present

## 2023-02-10 ENCOUNTER — Ambulatory Visit (INDEPENDENT_AMBULATORY_CARE_PROVIDER_SITE_OTHER): Payer: 59

## 2023-02-10 DIAGNOSIS — J309 Allergic rhinitis, unspecified: Secondary | ICD-10-CM | POA: Diagnosis not present

## 2023-02-22 ENCOUNTER — Ambulatory Visit (INDEPENDENT_AMBULATORY_CARE_PROVIDER_SITE_OTHER): Payer: 59

## 2023-02-22 DIAGNOSIS — J309 Allergic rhinitis, unspecified: Secondary | ICD-10-CM

## 2023-03-10 ENCOUNTER — Ambulatory Visit (INDEPENDENT_AMBULATORY_CARE_PROVIDER_SITE_OTHER): Payer: 59

## 2023-03-10 DIAGNOSIS — J309 Allergic rhinitis, unspecified: Secondary | ICD-10-CM | POA: Diagnosis not present

## 2023-03-10 DIAGNOSIS — J3089 Other allergic rhinitis: Secondary | ICD-10-CM | POA: Diagnosis not present

## 2023-03-10 NOTE — Progress Notes (Signed)
VIALS EXP 03-09-24

## 2023-03-24 ENCOUNTER — Ambulatory Visit (INDEPENDENT_AMBULATORY_CARE_PROVIDER_SITE_OTHER): Payer: 59

## 2023-03-24 DIAGNOSIS — J309 Allergic rhinitis, unspecified: Secondary | ICD-10-CM | POA: Diagnosis not present

## 2023-04-07 ENCOUNTER — Ambulatory Visit: Payer: 59

## 2023-04-07 DIAGNOSIS — J309 Allergic rhinitis, unspecified: Secondary | ICD-10-CM | POA: Diagnosis not present

## 2023-04-07 MED ORDER — EPINEPHRINE 0.3 MG/0.3ML IJ SOAJ: 0.3000 mg | INTRAMUSCULAR | 2 refills | Status: AC | PRN

## 2023-04-11 NOTE — Progress Notes (Deleted)
Follow Up Note  RE: Ronald Roy MRN: [id number] DOB: 10/13/1970 Date of Office Visit: 04/12/2023  Referring provider: [name], MD Primary care provider: [name], MD  Chief Complaint: No chief complaint on file.  History of Present Illness: I had the pleasure of seeing Ronald Roy for a follow up visit at the [facility] on 04/11/2023. He is a 52 y.o. male, who is being followed for allergic rhinitis on AIT and asthma. His previous allergy office visit was on 11/03/2021 with Dr. Selena Batten. Today is a regular follow up visit.  Discussed the use of AI scribe software for clinical note transcription with the patient, who gave verbal consent to proceed.  History of Present Illness            Seasonal and perennial allergic rhinitis Past history - Perennial rhinitis symptoms with worsening the spring and fall.  Main triggers include cats which also flare his asthma.  2019 skin testing was positive to trees, mold, dust mites and cat.  Patient was on allergy immunotherapy with good benefit and would like to continue. 1 dog at home. 2 cats passed away. Interim history - started AIT on 06/03/21 (T-DM & M-C) with no issues. Wife wants to get a cat.  Continue environmental control measures. Use over the counter antihistamines such as Zyrtec (cetirizine), Claritin (loratadine), Allegra (fexofenadine), or Xyzal (levocetirizine) daily as needed. May take twice a day during allergy flares. May switch antihistamines every few months. Use Flonase (fluticasone) nasal spray 1 spray per nostril twice a day as needed for nasal congestion.  Continue allergy injections - given today.  If you get a cat and notice worsening symptoms let us know.   Mild intermittent asthma without complication Past history - Asthma since his 15s which was quiescent up until exposure to 2 cats at home which required multiple courses of systemic steroids.  Since the cats have passed away no  recent albuterol use. 2022 spirometry showed some restriction most likely due to body habitus. Interim history - asymptomatic and no albuterol use.  May use albuterol rescue inhaler 2 puffs or nebulizer every 4 to 6 hours as needed for shortness of breath, chest tightness, coughing, and wheezing. Monitor frequency of use.    Return in about 1 year (around 11/04/2022).  Assessment and Plan: Ronald Roy is a 52 y.o. male with: Seasonal allergic rhinitis due to pollen Allergic rhinitis due to animal dander Allergic rhinitis due to dust mite Allergic rhinitis due to mold Past history - Perennial rhinitis symptoms with worsening the spring and fall.  Main triggers include cats which also flare his asthma.  2019 skin testing positive to trees, mold, dust mites and cat.  Patient was on allergy immunotherapy with good benefit and would like to continue. 1 dog at home. 2 cats passed away. Started AIT on Jun 03, 2021 (T-DM & M-C) Interim history -   Mild intermittent asthma without complication Past history - Asthma since his 1s which was quiescent up until exposure to 2 cats at home which required multiple courses of systemic steroids.  Since the cats have passed away no recent albuterol use. 2022 spirometry showed some restriction most likely due to body habitus. Interim history -   Assessment and Plan              No follow-ups on file.  No orders of the defined types were placed in this encounter.  Lab Orders  No laboratory test(s) ordered today  Diagnostics: Spirometry:  Tracings reviewed. His effort: {Blank single:19197::"Good reproducible efforts.","It was hard to get consistent efforts and there is a question as to whether this reflects a maximal maneuver.","Poor effort, data can not be interpreted."} FVC: ***L FEV1: ***L, ***% predicted FEV1/FVC ratio: ***% Interpretation: {Blank single:19197::"Spirometry consistent with mild obstructive disease","Spirometry consistent with  moderate obstructive disease","Spirometry consistent with severe obstructive disease","Spirometry consistent with possible restrictive disease","Spirometry consistent with mixed obstructive and restrictive disease","Spirometry uninterpretable due to technique","Spirometry consistent with normal pattern","No overt abnormalities noted given today's efforts"}.  Please see scanned spirometry results for details.  Skin Testing: {Blank single:19197::"Select foods","Environmental allergy panel","Environmental allergy panel and select foods","Food allergy panel","None","Deferred due to recent antihistamines use"}. *** Results discussed with patient/family.   Medication List:  Current Outpatient Medications  Medication Sig Dispense Refill  . allopurinol (ZYLOPRIM) 300 MG tablet     . atorvastatin (LIPITOR) 40 MG tablet Take 40 mg by mouth daily.    . colchicine 0.6 MG tablet TAKE 1 TABLET BY MOUTH ONCE A DAY AS NEEDED FOR GOUT FLARE UPS    . EPINEPHrine 0.3 mg/0.3 mL IJ SOAJ injection Inject 0.3 mg into the muscle as needed for anaphylaxis. 2 each 2  . hydrochlorothiazide (HYDRODIURIL) 25 MG tablet Take 25 mg by mouth daily.    Marland Kitchen levocetirizine (XYZAL) 5 MG tablet Take 5 mg by mouth every evening.    . SUMAtriptan (IMITREX) 50 MG tablet Take 1 tablet by mouth as needed.     No current facility-administered medications for this visit.   Allergies: Allergies  Allergen Reactions  . Amlodipine Besylate Other (See Comments)   I reviewed his past medical history, social history, family history, and environmental history and no significant changes have been reported from his previous visit.  Review of Systems  Constitutional:  Negative for appetite change, chills, fever and unexpected weight change.  HENT:  Negative for congestion and rhinorrhea.   Eyes:  Negative for itching.  Respiratory:  Negative for cough, chest tightness, shortness of breath and wheezing.   Cardiovascular:  Negative for chest  pain.  Gastrointestinal:  Negative for abdominal pain.  Genitourinary:  Negative for difficulty urinating.  Skin:  Negative for rash.  Allergic/Immunologic: Positive for environmental allergies.  Neurological:  Negative for headaches.   Objective: There were no vitals taken for this visit. There is no height or weight on file to calculate BMI. Physical Exam Vitals and nursing note reviewed.  Constitutional:      Appearance: Normal appearance. He is well-developed. He is obese.  HENT:     Head: Normocephalic and atraumatic.     Right Ear: Tympanic membrane and external ear normal.     Left Ear: Tympanic membrane and external ear normal.     Nose: Nose normal.     Mouth/Throat:     Mouth: Mucous membranes are moist.     Pharynx: Oropharynx is clear.  Eyes:     Conjunctiva/sclera: Conjunctivae normal.  Cardiovascular:     Rate and Rhythm: Normal rate and regular rhythm.     Heart sounds: Normal heart sounds. No murmur heard.    No friction rub. No gallop.  Pulmonary:     Effort: Pulmonary effort is normal.     Breath sounds: Normal breath sounds. No wheezing, rhonchi or rales.  Musculoskeletal:     Cervical back: Neck supple.  Skin:    General: Skin is warm.     Findings: No rash.  Neurological:     Mental Status: He is alert  and oriented to person, place, and time.  Psychiatric:        Behavior: Behavior normal.  Previous notes and tests were reviewed. The plan was reviewed with the patient/family, and all questions/concerned were addressed.  It was my pleasure to see Harinder today and participate in his care. Please feel free to contact me with any questions or concerns.  Sincerely,  Wyline Mood, DO Allergy & Immunology  [facility] office: [phone] [facility] [place] office: [phone]

## 2023-04-12 ENCOUNTER — Ambulatory Visit: Payer: 59 | Admitting: Allergy

## 2023-04-12 DIAGNOSIS — J4520 Mild intermittent asthma, uncomplicated: Secondary | ICD-10-CM

## 2023-04-12 DIAGNOSIS — J3089 Other allergic rhinitis: Secondary | ICD-10-CM

## 2023-04-12 DIAGNOSIS — J3081 Allergic rhinitis due to animal (cat) (dog) hair and dander: Secondary | ICD-10-CM

## 2023-04-12 DIAGNOSIS — J301 Allergic rhinitis due to pollen: Secondary | ICD-10-CM

## 2023-04-14 ENCOUNTER — Ambulatory Visit: Payer: 59 | Admitting: *Deleted

## 2023-04-14 DIAGNOSIS — J309 Allergic rhinitis, unspecified: Secondary | ICD-10-CM

## 2023-04-25 NOTE — Progress Notes (Unsigned)
Follow Up Note  RE: [name] MRN: [id number] DOB: 1971-05-09 Date of Office Visit: 04/26/2023  Referring provider: [name], MD Primary care provider: [name], MD  Chief Complaint: No chief complaint on file.  History of Present Illness: I had the pleasure of seeing Ronald Roy for a follow up visit at the [facility] on 04/26/2023. He is a 52 y.o. male, who is being followed for allergic rhinitis on AIT and asthma. His previous allergy office visit was on 11/03/2021 with Dr. Selena Batten. Today is a regular follow up visit.  Discussed the use of AI scribe software for clinical note transcription with the patient, who gave verbal consent to proceed.  The patient, with a known history of allergies, has been receiving weekly allergy shots. He reports a single instance of large localized swelling to injections. However, he has not experienced any significant issues with new vials, such as increased itching or redness. The patient believes the shots have been beneficial, noting less annoyance during the fall season. He only takes allergy pills before and on the day of his shots, and has not used a nasal spray since the spring.  The patient has a history of severe allergic reactions to cats, which previously exacerbated his asthma.  He plans to test his tolerance by spending time in environments with cats before making a decision of whether to get a new cat or not. He has not needed to use his inhaler recently and has not been around cats for a significant period. He is currently living with a dog, and there are no issues with allergies related to the dog.      Assessment and Plan: [name] is a 52 y.o. male with: Seasonal allergic rhinitis due to pollen Allergic rhinitis due to animal dander Allergic rhinitis due to dust mite Allergic rhinitis due to mold Past history - Main triggers include cats which also flare his asthma.  2019 skin testing positive to  trees, mold, dust mites and cat.  Patient was on allergy immunotherapy with good benefit and would like to continue. 1 dog at home. 2 cats passed away. Started AIT on 15-Jun-2021 (T-DM & M-C). Interim history - Noted improvement in symptoms with less need for nasal sprays or allergy pills Continue environmental control measures. Use over the counter antihistamines such as Zyrtec (cetirizine), Claritin (loratadine), Allegra (fexofenadine), or Xyzal (levocetirizine) daily as needed. May take twice a day during allergy flares. May switch antihistamines every few months. Use Flonase (fluticasone) nasal spray 1-2 sprays per nostril once a day as needed for nasal congestion.  Nasal saline spray (i.e., Simply Saline) or nasal saline lavage (i.e., NeilMed) is recommended as needed and prior to medicated nasal sprays. Continue allergy injections - given today.  Will build up new vials faster (0.1, 0.3, 0.5) and then will go to every 2 weeks for current red vial and then every 4 weeks with subsequent vials.  If you get a cat and notice worsening symptoms let us know.   Mild intermittent asthma without complication Past history - Asthma since his 4s which was quiescent up until exposure to 2 cats at home which required multiple courses of systemic steroids.  Since the cats have passed away no recent albuterol use. 2022 spirometry showed some restriction most likely due to body habitus. Interim history - asymptomatic and no albuterol use but also not around cats.  May use albuterol rescue inhaler 2 puffs or nebulizer every 4 to 6 hours  as needed for shortness of breath, chest tightness, coughing, and wheezing. Monitor frequency of use.   Return in about 1 year (around 04/25/2024).  Meds ordered this encounter  Medications   albuterol (VENTOLIN HFA) 108 (90 Base) MCG/ACT inhaler    Sig: Inhale 2 puffs into the lungs every 4 (four) hours as needed for wheezing or shortness of breath (coughing fits).     Dispense:  18 g    Refill:  1   Lab Orders  No laboratory test(s) ordered today    Diagnostics: None.   Medication List:  Current Outpatient Medications  Medication Sig Dispense Refill   albuterol (VENTOLIN HFA) 108 (90 Base) MCG/ACT inhaler Inhale 2 puffs into the lungs every 4 (four) hours as needed for wheezing or shortness of breath (coughing fits). 18 g 1   allopurinol (ZYLOPRIM) 300 MG tablet      atorvastatin (LIPITOR) 40 MG tablet Take 40 mg by mouth daily.     EPINEPHrine 0.3 mg/0.3 mL IJ SOAJ injection Inject 0.3 mg into the muscle as needed for anaphylaxis. 2 each 2   fluticasone (FLONASE) 50 MCG/ACT nasal spray 1-2 spray in each nostril Nasally Twice a day     hydrochlorothiazide (HYDRODIURIL) 25 MG tablet Take 25 mg by mouth daily.     levocetirizine (XYZAL) 5 MG tablet Take 5 mg by mouth every evening.     meloxicam (MOBIC) 7.5 MG tablet Take 7.5-15 mg by mouth daily.     nebivolol (BYSTOLIC) 2.5 MG tablet Take 2.5 mg by mouth daily.     SUMAtriptan (IMITREX) 50 MG tablet Take 1 tablet by mouth as needed.     zolpidem (AMBIEN) 10 MG tablet 1 tablet Orally Once a day at bedtime for 90 days     No current facility-administered medications for this visit.   Allergies: Allergies  Allergen Reactions   Amlodipine Besylate Other (See Comments)   I reviewed his past medical history, social history, family history, and environmental history and no significant changes have been reported from his previous visit.  Review of Systems  Constitutional:  Negative for appetite change, chills, fever and unexpected weight change.  HENT:  Negative for congestion and rhinorrhea.   Eyes:  Negative for itching.  Respiratory:  Negative for cough, chest tightness, shortness of breath and wheezing.   Cardiovascular:  Negative for chest pain.  Gastrointestinal:  Negative for abdominal pain.  Genitourinary:  Negative for difficulty urinating.  Skin:  Negative for rash.   Allergic/Immunologic: Positive for environmental allergies.  Neurological:  Negative for headaches.    Objective: BP 136/76   Pulse 66   Temp 98.4 F (36.9 C)   Resp 18   Ht 6' 1.5" (1.867 m)   Wt (!) 316 lb (143.3 kg)   SpO2 97%   BMI 41.13 kg/m  Body mass index is 41.13 kg/m. Physical Exam Vitals and nursing note reviewed.  Constitutional:      Appearance: Normal appearance. He is well-developed. He is obese.  HENT:     Head: Normocephalic and atraumatic.     Right Ear: Tympanic membrane and external ear normal.     Left Ear: Tympanic membrane and external ear normal.     Nose: Nose normal.     Mouth/Throat:     Mouth: Mucous membranes are moist.     Pharynx: Oropharynx is clear.  Eyes:     Conjunctiva/sclera: Conjunctivae normal.  Cardiovascular:     Rate and Rhythm: Normal rate and regular rhythm.  Heart sounds: Normal heart sounds. No murmur heard.    No friction rub. No gallop.  Pulmonary:     Effort: Pulmonary effort is normal.     Breath sounds: Normal breath sounds. No wheezing, rhonchi or rales.  Musculoskeletal:     Cervical back: Neck supple.  Skin:    General: Skin is warm.     Findings: No rash.  Neurological:     Mental Status: He is alert and oriented to person, place, and time.  Psychiatric:        Behavior: Behavior normal.    Previous notes and tests were reviewed. The plan was reviewed with the patient/family, and all questions/concerned were addressed.  It was my pleasure to see Zaman today and participate in his care. Please feel free to contact me with any questions or concerns.  Sincerely,  Wyline Mood, DO Allergy & Immunology  [facility] office: [phone] [facility] office: [phone]

## 2023-04-26 ENCOUNTER — Ambulatory Visit (INDEPENDENT_AMBULATORY_CARE_PROVIDER_SITE_OTHER): Payer: 59 | Admitting: Allergy

## 2023-04-26 ENCOUNTER — Encounter: Payer: Self-pay | Admitting: Allergy

## 2023-04-26 ENCOUNTER — Telehealth: Payer: Self-pay

## 2023-04-26 VITALS — BP 136/76 | HR 66 | Temp 98.4°F | Resp 18 | Ht 73.5 in | Wt 316.0 lb

## 2023-04-26 DIAGNOSIS — J4520 Mild intermittent asthma, uncomplicated: Secondary | ICD-10-CM | POA: Diagnosis not present

## 2023-04-26 DIAGNOSIS — J301 Allergic rhinitis due to pollen: Secondary | ICD-10-CM | POA: Diagnosis not present

## 2023-04-26 DIAGNOSIS — J3081 Allergic rhinitis due to animal (cat) (dog) hair and dander: Secondary | ICD-10-CM

## 2023-04-26 DIAGNOSIS — J3089 Other allergic rhinitis: Secondary | ICD-10-CM

## 2023-04-26 MED ORDER — ALBUTEROL SULFATE HFA 108 (90 BASE) MCG/ACT IN AERS: 2.0000 | INHALATION_SPRAY | RESPIRATORY_TRACT | 1 refills | Status: AC | PRN

## 2023-04-26 NOTE — Telephone Encounter (Signed)
Flow sheet has been updated.

## 2023-04-26 NOTE — Patient Instructions (Addendum)
Environmental allergies 2019 skin testing was positive to trees, mold, dust mites and cat.  Continue environmental control measures. Use over the counter antihistamines such as Zyrtec (cetirizine), Claritin (loratadine), Allegra (fexofenadine), or Xyzal (levocetirizine) daily as needed. May take twice a day during allergy flares. May switch antihistamines every few months. Use Flonase (fluticasone) nasal spray 1-2 sprays per nostril once a day as needed for nasal congestion.  Nasal saline spray (i.e., Simply Saline) or nasal saline lavage (i.e., NeilMed) is recommended as needed and prior to medicated nasal sprays. Continue allergy injections - given today.  Will build up new vials faster (0.1, 0.3, 0.5) and then will go to every 2 weeks for current red vial and then every 4 weeks with subsequent vials.  If you get a cat and notice worsening symptoms let us know.  Asthma May use albuterol rescue inhaler 2 puffs every 4 to 6 hours as needed for shortness of breath, chest tightness, coughing, and wheezing. Monitor frequency of use.   Follow up in 12 months or sooner if needed.

## 2023-04-26 NOTE — Telephone Encounter (Signed)
-----   Message from Ellamae Sia sent at 04/26/2023  1:06 PM EST ----- Will build up new vials faster (0.1, 0.3, 0.5) and then will go to every 2 weeks for current red vial and then every 4 weeks with subsequent vials.

## 2023-05-03 ENCOUNTER — Ambulatory Visit (HOSPITAL_COMMUNITY)
Admission: RE | Admit: 2023-05-03 | Discharge: 2023-05-03 | Disposition: A | Discharge status: Home / Self Care | Payer: 59 | Source: Ambulatory Visit | Attending: Family Medicine | Admitting: Family Medicine

## 2023-05-03 ENCOUNTER — Other Ambulatory Visit (HOSPITAL_COMMUNITY): Payer: Self-pay | Admitting: Family Medicine

## 2023-05-03 DIAGNOSIS — N50812 Left testicular pain: Secondary | ICD-10-CM | POA: Diagnosis present

## 2023-05-05 ENCOUNTER — Ambulatory Visit: Payer: 59

## 2023-05-12 ENCOUNTER — Ambulatory Visit: Payer: 59

## 2023-05-12 DIAGNOSIS — J309 Allergic rhinitis, unspecified: Secondary | ICD-10-CM | POA: Diagnosis not present

## 2023-05-31 ENCOUNTER — Ambulatory Visit: Payer: 59

## 2023-05-31 DIAGNOSIS — J309 Allergic rhinitis, unspecified: Secondary | ICD-10-CM

## 2023-06-09 ENCOUNTER — Ambulatory Visit: Payer: 59

## 2023-06-09 DIAGNOSIS — J309 Allergic rhinitis, unspecified: Secondary | ICD-10-CM

## 2023-06-28 ENCOUNTER — Ambulatory Visit (INDEPENDENT_AMBULATORY_CARE_PROVIDER_SITE_OTHER): Payer: 59

## 2023-06-28 DIAGNOSIS — J309 Allergic rhinitis, unspecified: Secondary | ICD-10-CM | POA: Diagnosis not present

## 2023-07-07 ENCOUNTER — Ambulatory Visit (INDEPENDENT_AMBULATORY_CARE_PROVIDER_SITE_OTHER): Payer: 59

## 2023-07-07 DIAGNOSIS — J309 Allergic rhinitis, unspecified: Secondary | ICD-10-CM

## 2023-07-12 ENCOUNTER — Ambulatory Visit (INDEPENDENT_AMBULATORY_CARE_PROVIDER_SITE_OTHER): Payer: 59

## 2023-07-12 DIAGNOSIS — J309 Allergic rhinitis, unspecified: Secondary | ICD-10-CM | POA: Diagnosis not present

## 2023-07-26 ENCOUNTER — Ambulatory Visit (INDEPENDENT_AMBULATORY_CARE_PROVIDER_SITE_OTHER)

## 2023-07-26 DIAGNOSIS — J309 Allergic rhinitis, unspecified: Secondary | ICD-10-CM | POA: Diagnosis not present

## 2023-08-09 ENCOUNTER — Ambulatory Visit (INDEPENDENT_AMBULATORY_CARE_PROVIDER_SITE_OTHER)

## 2023-08-09 DIAGNOSIS — J309 Allergic rhinitis, unspecified: Secondary | ICD-10-CM | POA: Diagnosis not present

## 2023-08-30 ENCOUNTER — Ambulatory Visit (INDEPENDENT_AMBULATORY_CARE_PROVIDER_SITE_OTHER)

## 2023-08-30 DIAGNOSIS — J309 Allergic rhinitis, unspecified: Secondary | ICD-10-CM

## 2023-08-30 NOTE — Progress Notes (Signed)
 VIALS MADE 08-30-23. EXP 08-29-24

## 2023-08-31 DIAGNOSIS — J3089 Other allergic rhinitis: Secondary | ICD-10-CM | POA: Diagnosis not present

## 2023-09-29 ENCOUNTER — Ambulatory Visit

## 2023-09-29 DIAGNOSIS — J309 Allergic rhinitis, unspecified: Secondary | ICD-10-CM | POA: Diagnosis not present

## 2023-11-15 ENCOUNTER — Ambulatory Visit (INDEPENDENT_AMBULATORY_CARE_PROVIDER_SITE_OTHER)

## 2023-11-15 DIAGNOSIS — J309 Allergic rhinitis, unspecified: Secondary | ICD-10-CM

## 2023-11-29 ENCOUNTER — Ambulatory Visit (INDEPENDENT_AMBULATORY_CARE_PROVIDER_SITE_OTHER)

## 2023-11-29 DIAGNOSIS — J309 Allergic rhinitis, unspecified: Secondary | ICD-10-CM

## 2023-12-06 ENCOUNTER — Ambulatory Visit (INDEPENDENT_AMBULATORY_CARE_PROVIDER_SITE_OTHER)

## 2023-12-06 DIAGNOSIS — J309 Allergic rhinitis, unspecified: Secondary | ICD-10-CM | POA: Diagnosis not present

## 2024-01-03 ENCOUNTER — Ambulatory Visit (INDEPENDENT_AMBULATORY_CARE_PROVIDER_SITE_OTHER)

## 2024-01-03 DIAGNOSIS — J309 Allergic rhinitis, unspecified: Secondary | ICD-10-CM | POA: Diagnosis not present

## 2024-01-22 IMAGING — CT CT ABD-PELV W/ CM
1 of 3 series · 13 of 32 positions shown, 18 images · IV contrast (APPLIED)
Comparison: Remote abdominal ultrasound 11/12/2009, remote
abdominal CT 09/02/2006

CLINICAL DATA: Gross hematuria. Patient reports right-sided flank
pain.

EXAM:
CT ABDOMEN AND PELVIS WITH CONTRAST
TECHNIQUE: Multidetector CT imaging of the abdomen and pelvis was performed
using the standard protocol following bolus administration of
intravenous contrast.

[Series 2: abd/pelvis w/cm · axial · 0.98mm/px · z∈[-583,-93]mm · 13 of 114 slices shown, 18 images]
[im 8/114  soft-tissue]
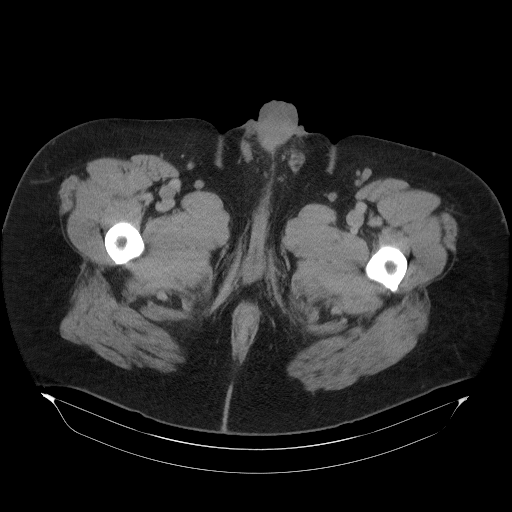
[im 8/114  bone]
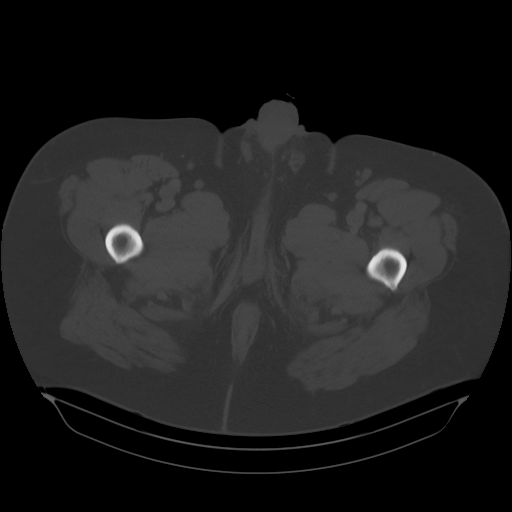
[im 16/114  soft-tissue]
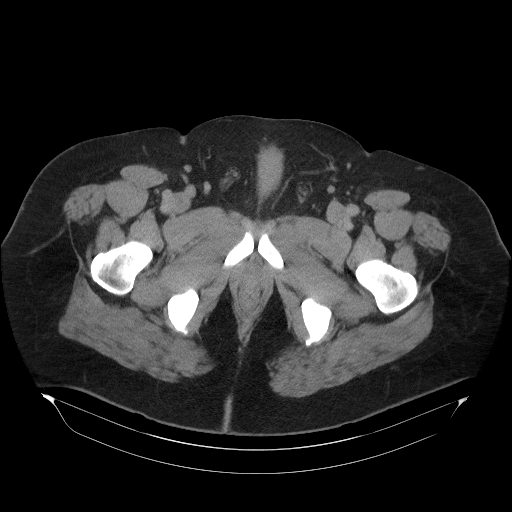
[im 23/114  soft-tissue]
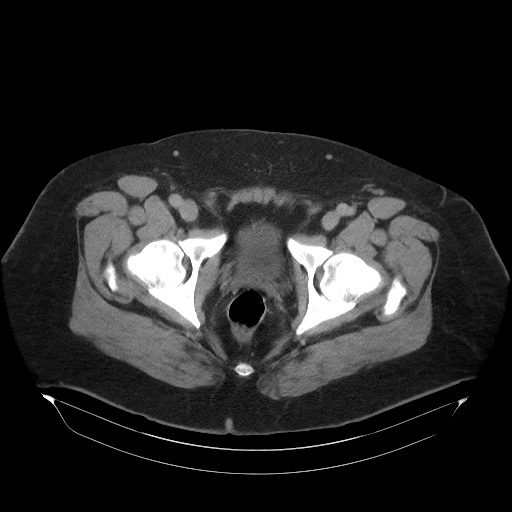
[im 38/114  soft-tissue]
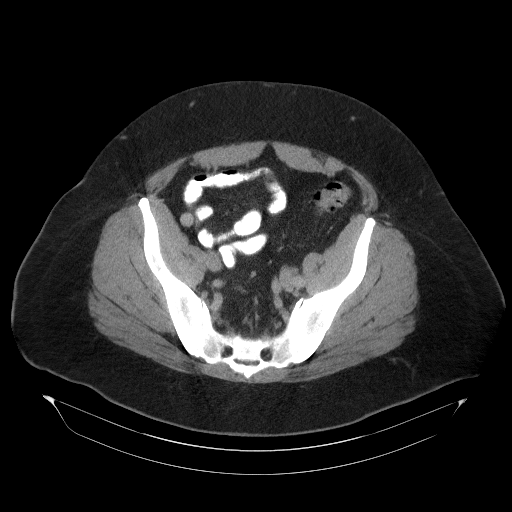
[im 46/114  soft-tissue]
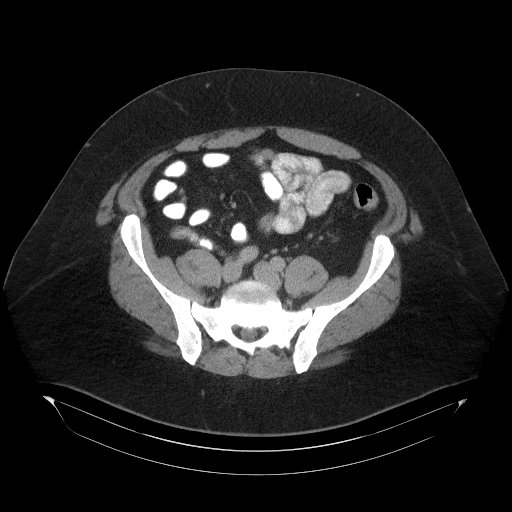
[im 53/114  soft-tissue]
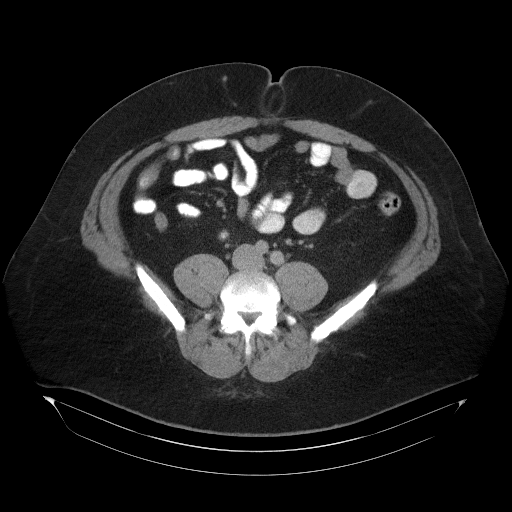
[im 61/114  soft-tissue]
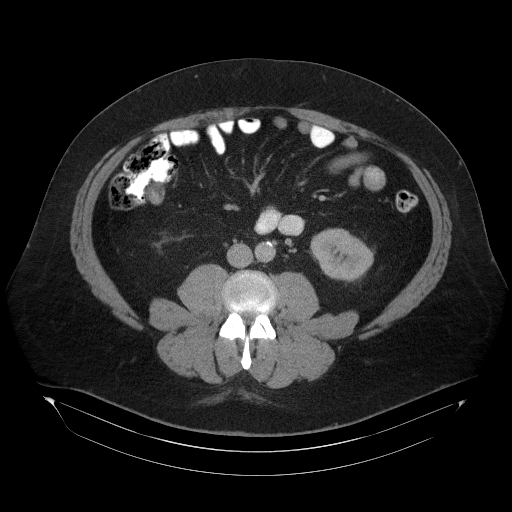
[im 68/114  soft-tissue]
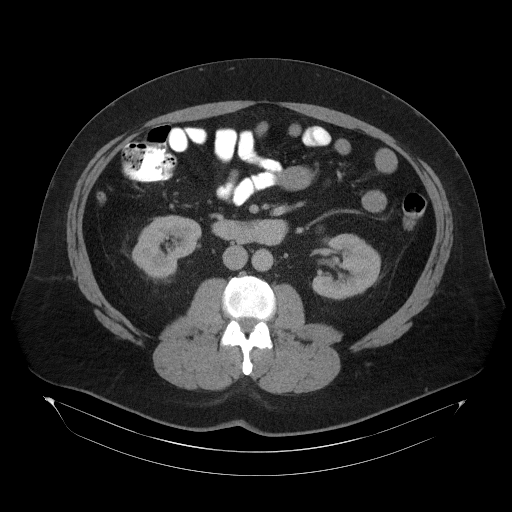
[im 76/114  soft-tissue]
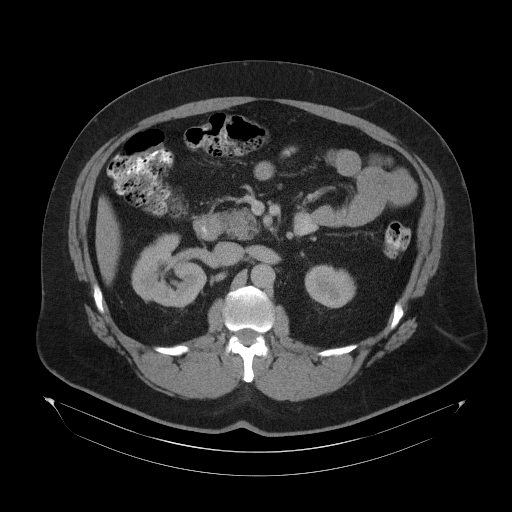
[im 76/114  bone]
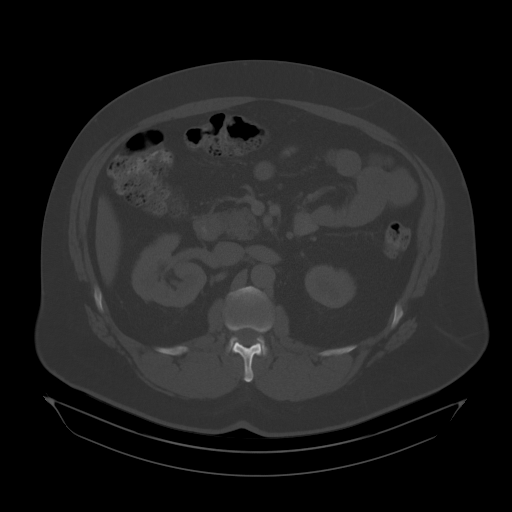
[im 83/114  lung]
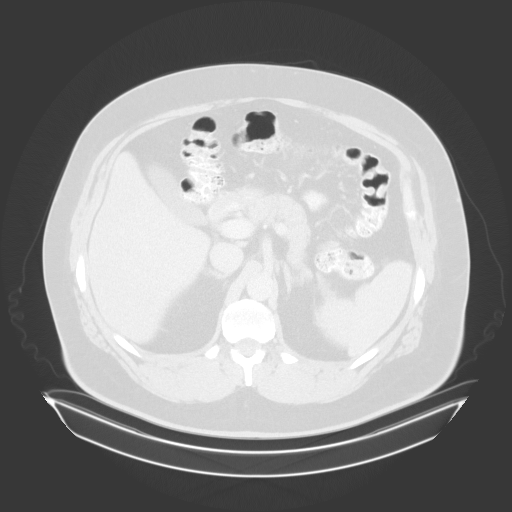
[im 91/114  soft-tissue]
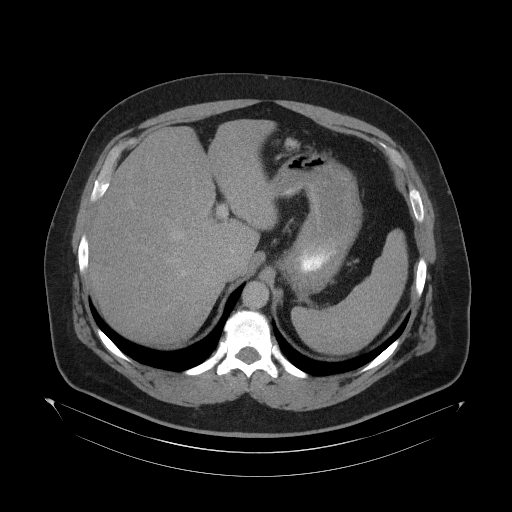
[im 91/114  lung]
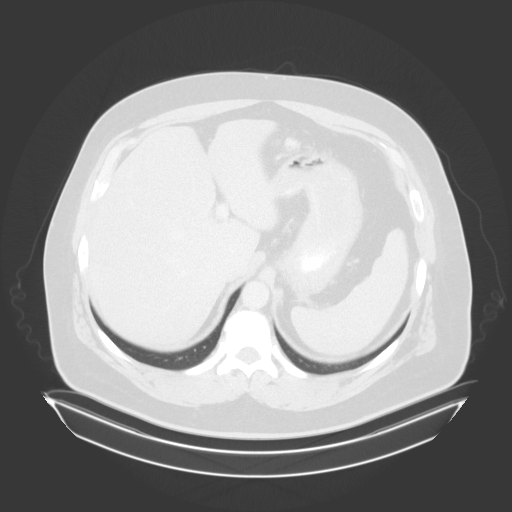
[im 98/114  soft-tissue]
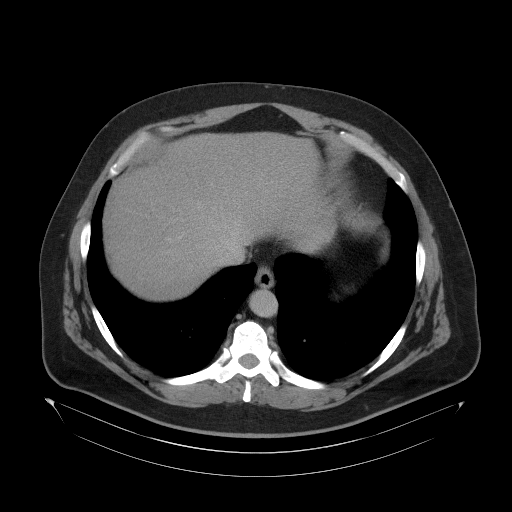
[im 98/114  lung]
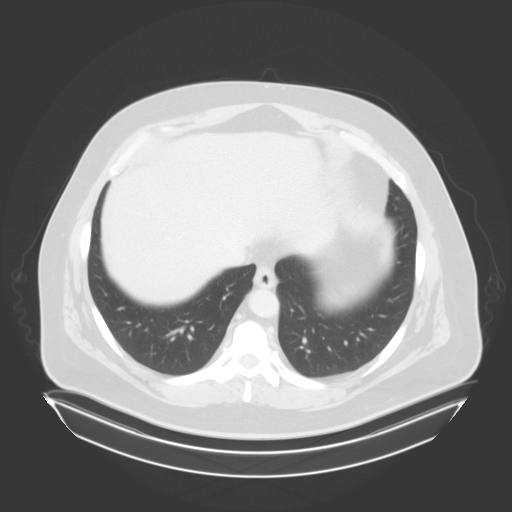
[im 106/114  soft-tissue]
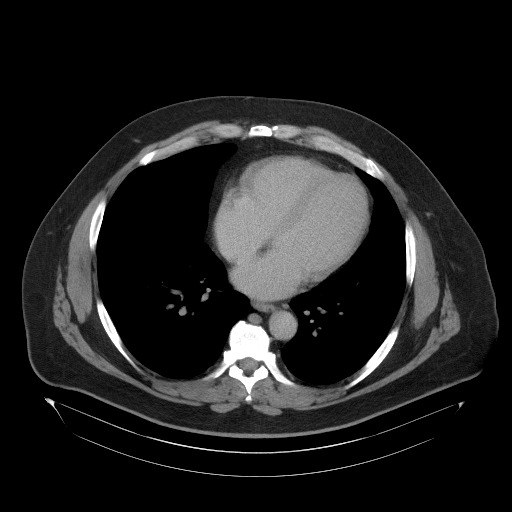
[im 106/114  lung]
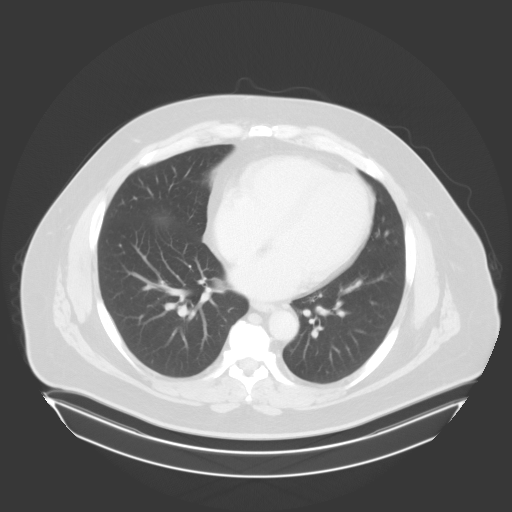

[13 of 32 positions shown; findings below may reference images not displayed]

RADIATION DOSE REDUCTION: This exam was performed according to the
departmental dose-optimization program which includes automated
exposure control, adjustment of the mA and/or kV according to
patient size and/or use of iterative reconstruction technique.

CONTRAST:  75mL [id number] IOPAMIDOL ([id number]) INJECTION 61%
FINDINGS: Lower chest: Clear lung bases.  Normal heart size.

Hepatobiliary: Diffusely decreased hepatic density typical of
steatosis. No focal liver lesion is seen. Gallbladder
physiologically distended, no calcified stone. No biliary
dilatation.

Pancreas: Unremarkable. No pancreatic ductal dilatation or
surrounding inflammatory changes.

Spleen: Normal in size. There is a 17 mm pericapsular cyst about the
superior aspect of the spleen.

Adrenals/Urinary Tract: Normal adrenal glands. No renal calculi. No
hydronephrosis. Homogeneous renal enhancement with symmetric
excretion on delayed phase imaging. There is a 13 mm exophytic cyst
from the lower right kidney measuring simple fluid density. 16 mm
cyst in the posterior mid right kidney. 15 mm cyst from the anterior
mid left kidney. No solid renal lesions. Both ureters are
decompressed without stones along the course. Urinary bladder is
partially distended. No bladder stone or wall thickening

Stomach/Bowel: Tiny hiatal hernia. Stomach is otherwise
unremarkable. No small bowel obstruction or inflammation. Normal
appendix. Small-moderate volume of colonic stool. Distal descending
and sigmoid diverticulosis without diverticulitis. No colonic
inflammation.

Vascular/Lymphatic: Mild aortic atherosclerosis. No aortic aneurysm.
The portal and mesenteric veins are patent. No enlarged lymph nodes
in the abdomen or pelvis.

Reproductive: Prostate is unremarkable. There are 2 linear densities
within the central aspect of the penis in the region of the penile
urethra, 2.4 and 1.2 cm. No adjacent air or soft tissue
inflammation.

Other: No ascites. There is a small fat containing umbilical hernia.
Minimal fat in the inguinal canals.

Musculoskeletal: Punctate bone islands in the pelvis. There are no
acute or suspicious osseous abnormalities.
IMPRESSION: 1. Two linear densities within the central aspect of the penis in
the region of the penile urethra, 2.4 and 1.2 cm. These likely
represent calcifications, however cannot exclude foreign bodies. No
adjacent air or soft tissue inflammation. Recommend correlation for
any history of foreign body.
2. No renal stones or obstructive uropathy. No explanation for
hematuria.
3. Small simple bilateral renal cysts, needing no further follow-up.
4. Colonic diverticulosis without diverticulitis.
5. Hepatic steatosis.

Aortic Atherosclerosis ([id number]-Y6Z.Z).

## 2024-02-07 ENCOUNTER — Ambulatory Visit (INDEPENDENT_AMBULATORY_CARE_PROVIDER_SITE_OTHER)

## 2024-02-07 DIAGNOSIS — J309 Allergic rhinitis, unspecified: Secondary | ICD-10-CM

## 2024-02-28 ENCOUNTER — Ambulatory Visit (INDEPENDENT_AMBULATORY_CARE_PROVIDER_SITE_OTHER)

## 2024-02-28 DIAGNOSIS — J309 Allergic rhinitis, unspecified: Secondary | ICD-10-CM

## 2024-03-08 ENCOUNTER — Ambulatory Visit

## 2024-03-08 DIAGNOSIS — J309 Allergic rhinitis, unspecified: Secondary | ICD-10-CM

## 2024-03-15 ENCOUNTER — Ambulatory Visit (INDEPENDENT_AMBULATORY_CARE_PROVIDER_SITE_OTHER)

## 2024-03-15 DIAGNOSIS — J309 Allergic rhinitis, unspecified: Secondary | ICD-10-CM | POA: Diagnosis not present

## 2024-03-22 ENCOUNTER — Ambulatory Visit (INDEPENDENT_AMBULATORY_CARE_PROVIDER_SITE_OTHER)

## 2024-03-22 DIAGNOSIS — J309 Allergic rhinitis, unspecified: Secondary | ICD-10-CM | POA: Diagnosis not present

## 2024-04-16 ENCOUNTER — Other Ambulatory Visit: Payer: Self-pay | Admitting: Physician Assistant

## 2024-04-16 DIAGNOSIS — R1011 Right upper quadrant pain: Secondary | ICD-10-CM

## 2024-04-17 ENCOUNTER — Ambulatory Visit (INDEPENDENT_AMBULATORY_CARE_PROVIDER_SITE_OTHER)

## 2024-04-17 DIAGNOSIS — R1011 Right upper quadrant pain: Secondary | ICD-10-CM | POA: Diagnosis not present

## 2024-04-23 HISTORY — PX: ESOPHAGOGASTRODUODENOSCOPY: SHX1529

## 2024-04-24 ENCOUNTER — Ambulatory Visit

## 2024-04-24 DIAGNOSIS — J309 Allergic rhinitis, unspecified: Secondary | ICD-10-CM

## 2024-04-27 ENCOUNTER — Ambulatory Visit: Admitting: Gastroenterology

## 2024-04-27 ENCOUNTER — Encounter: Payer: Self-pay | Admitting: Gastroenterology

## 2024-04-27 ENCOUNTER — Other Ambulatory Visit

## 2024-04-27 VITALS — BP 142/76 | HR 54 | Ht 72.0 in | Wt 301.1 lb

## 2024-04-27 DIAGNOSIS — R12 Heartburn: Secondary | ICD-10-CM

## 2024-04-27 DIAGNOSIS — R1011 Right upper quadrant pain: Secondary | ICD-10-CM | POA: Diagnosis not present

## 2024-04-27 DIAGNOSIS — K760 Fatty (change of) liver, not elsewhere classified: Secondary | ICD-10-CM

## 2024-04-27 DIAGNOSIS — R1013 Epigastric pain: Secondary | ICD-10-CM

## 2024-04-27 DIAGNOSIS — Z791 Long term (current) use of non-steroidal anti-inflammatories (NSAID): Secondary | ICD-10-CM

## 2024-04-27 LAB — HEPATIC FUNCTION PANEL
ALT: 22 U/L (ref 0–53)
AST: 16 U/L (ref 0–37)
Albumin: 4.6 g/dL (ref 3.5–5.2)
Alkaline Phosphatase: 63 U/L (ref 39–117)
Bilirubin, Direct: 0.2 mg/dL (ref 0.0–0.3)
Total Bilirubin: 0.9 mg/dL (ref 0.2–1.2)
Total Protein: 7.9 g/dL (ref 6.0–8.3)

## 2024-04-27 LAB — PROTIME-INR
INR: 1.2 ratio — ABNORMAL HIGH (ref 0.8–1.0)
Prothrombin Time: 12.4 s (ref 9.6–13.1)

## 2024-04-27 MED ORDER — OMEPRAZOLE 40 MG PO CPDR: 40.0000 mg | DELAYED_RELEASE_CAPSULE | Freq: Every day | ORAL | 3 refills | Status: DC

## 2024-04-27 NOTE — Patient Instructions (Signed)
 Your provider has requested that you go to the basement level for lab work before leaving today. Press B on the elevator. The lab is located at the first door on the left as you exit the elevator.  We have sent the following medications to your pharmacy for you to pick up at your convenience: Omeprazole    You have been scheduled for an endoscopy. Please follow written instructions given to you at your visit today.  If you use inhalers (even only as needed), please bring them with you on the day of your procedure.  If you take any of the following medications, they will need to be adjusted prior to your procedure:   DO NOT TAKE 7 DAYS PRIOR TO TEST- Trulicity (dulaglutide) Ozempic, Wegovy (semaglutide) Mounjaro, Zepbound (tirzepatide) Bydureon Bcise (exanatide extended release)  DO NOT TAKE 1 DAY PRIOR TO YOUR TEST Rybelsus (semaglutide) Adlyxin (lixisenatide) Victoza (liraglutide) Byetta (exanatide) ___________________________________________________________________________  Avoid NSAIDS and Alcohol   Due to recent changes in healthcare laws, you may see the results of your imaging and laboratory studies on MyChart before your provider has had a chance to review them.  We understand that in some cases there may be results that are confusing or concerning to you. Not all laboratory results come back in the same time frame and the provider may be waiting for multiple results in order to interpret others.  Please give us  48 hours in order for your provider to thoroughly review all the results before contacting the office for clarification of your results.    Thank you for choosing me and [facility].

## 2024-04-27 NOTE — Progress Notes (Signed)
 Ronald Roy [id number] [birth year]   Chief Complaint: Heartburn, indigestion, abdominal pain, abnormal ultrasound  Referring Provider: Seabron Lenis, MD Primary GI MD: Dr. Avram  HPI: Ronald Roy is a 54 y.o. male with past medical history of asthma, depression, GERD, adenomatous colon polyps, HLD, HTN, obesity, OSA, fatty liver who presents today for a complaint of GERD, abdominal pain, abnormal ultrasound.    Patient last seen in office 10/26/2021 by Dr. Avram for follow-up of fatty liver and RUQ pain.  Symptoms had resolved which he attributed to improved eating.  Had been on omeprazole  and was advised to try coming off of it to see if things were improved with eating habits alone.  He was advised regarding low carbohydrate eating.  Advised to follow-up as needed.  Does not appear that he has had an upper endoscopy in the past.  Did have a colonoscopy in 2023.  Had a tubular adenoma and was advised to have repeat colonoscopy in 7 years.  Just recently had an RUQ ultrasound 04/17/2024 for RUQ pain which showed hepatic steatosis and otherwise unremarkable.  Outside labs 04/16/2024 showed normal liver enzymes, normal lipase  FIB-4 score based on labs from 11/2023 is 1.01, advanced fibrosis excluded.    Discussed the use of AI scribe software for clinical note transcription with the patient, who gave verbal consent to proceed.  History of Present Illness Ronald Roy is a 53 year old male with fatty liver disease who presents with indigestion, heartburn, and right upper quadrant abdominal pain.  Right upper abdominal pain - Persistent for approximately one month - Radiates to the back and epigastrium and is not always localized - Less intense than initial onset but still present - Eating sometimes alleviates the pain, but not consistently - Onset followed an episode of intense heartburn four weeks ago - No associated fever or chills  Gastrointestinal  symptoms - Indigestion and frequent burping - Occasional steatorrhea (excess fat in stool) - No blood in stool or melena - No current nausea or vomiting; brief episode of nausea two weeks ago  Heartburn and dyspepsia - Intense heartburn episode four weeks ago, relieved within 24 hours by Prilosec and chewable antacids - Intermittent use of omeprazole , with recent 40 mg dose providing some symptom relief  Dietary and weight changes - Recent dietary changes after Thanksgiving, focusing on healthier eating habits - Weight loss of 12-13 pounds over the last two weeks since making dietary changes  Medication and substance use - Daily meloxicam use for back pain, stopped in the last few days - Recent cessation of alcohol consumption; previously drank occasionally  Hepatic and metabolic history - History of fatty liver disease confirmed by ultrasound - Ultrasound showed no gallstones - History of hypertension and hyperlipidemia - Concerned about liver health  Constitutional and cardiorespiratory symptoms - No chest pain, shortness of breath, or history of heart or lung problems    Previous GI Procedures/Imaging   RUQ US  04/17/2024 IMPRESSION: Diffuse increased echogenicity of the hepatic parenchyma is a nonspecific indicator of hepatocellular dysfunction, most commonly steatosis.  Colonoscopy 08/21/2021 - Two 3 to 8 mm polyps in the rectum and in the sigmoid colon, removed with a cold snare. Resected and retrieved.  - One 1 mm polyp in the cecum, removed with a cold biopsy forceps. Resected and retrieved.  - Diverticulosis in the sigmoid colon.  - The examination was otherwise normal on direct and retroflexion views. - Recall 7 years Path: 1. Surgical [P], colon, cecum,  polyp (1) BENIGN COLONIC MUCOSA WITH PROMINENT BENIGN LYMPHOID AGGREGATE 2. Surgical [P], colon, rectum and sigmoid, polyp (2) TUBULAR ADENOMA HYPERPLASTIC POLYP NEGATIVE FOR HIGH-GRADE DYSPLASIA AND CARCINOMA  CT  A/P 08/10/2021 IMPRESSION: 1. Two linear densities within the central aspect of the penis in the region of the penile urethra, 2.4 and 1.2 cm. These likely represent calcifications, however cannot exclude foreign bodies. No adjacent air or soft tissue inflammation. Recommend correlation for any history of foreign body. 2. No renal stones or obstructive uropathy. No explanation for hematuria. 3. Small simple bilateral renal cysts, needing no further follow-up. 4. Colonic diverticulosis without diverticulitis. 5. Hepatic steatosis.  Past Medical History:  Diagnosis Date   Allergic rhinitis, unspecified    Allergy    Asthma    Depression    ED (erectile dysfunction)    Gastroesophageal reflux disease without esophagitis    Gout, unspecified    Hx of adenomatous polyp of colon 08/21/2021   8 mm adenoma - recall 2030   Hyperlipidemia    Hypertension    Insomnia    Major depression, single episode    Migraine, unspecified, not intractable, without status migrainosus    Mild episode of recurrent major depressive disorder    Mixed hyperlipidemia    Obesity    OSA (obstructive sleep apnea)    Situational stress    Sleep apnea     Past Surgical History:  Procedure Laterality Date   COLONOSCOPY     TONSILLECTOMY      Current Outpatient Medications  Medication Sig Dispense Refill   albuterol  (VENTOLIN  HFA) 108 (90 Base) MCG/ACT inhaler Inhale 2 puffs into the lungs every 4 (four) hours as needed for wheezing or shortness of breath (coughing fits). 18 g 1   atorvastatin (LIPITOR) 40 MG tablet Take 40 mg by mouth daily.     EPINEPHrine  0.3 mg/0.3 mL IJ SOAJ injection Inject 0.3 mg into the muscle as needed for anaphylaxis. 2 each 2   fluticasone (FLONASE) 50 MCG/ACT nasal spray 1-2 spray in each nostril Nasally Twice a day     hydrochlorothiazide (HYDRODIURIL) 25 MG tablet Take 25 mg by mouth daily.     levocetirizine (XYZAL) 5 MG tablet Take 5 mg by mouth every evening.      meloxicam (MOBIC) 7.5 MG tablet Take 7.5-15 mg by mouth daily.     nebivolol (BYSTOLIC) 2.5 MG tablet Take 2.5 mg by mouth daily.     SUMAtriptan (IMITREX) 50 MG tablet Take 1 tablet by mouth as needed.     zolpidem (AMBIEN) 10 MG tablet 1 tablet Orally Once a day at bedtime for 90 days     allopurinol (ZYLOPRIM) 300 MG tablet  (Patient not taking: Reported on 04/27/2024)     No current facility-administered medications for this visit.    Allergies as of 04/27/2024 - Review Complete 04/27/2024  Allergen Reaction Noted   Amlodipine besylate Other (See Comments) 07/12/2021    Family History  Problem Relation Age of Onset   Colon polyps Mother    Hypertension Mother    Colon polyps Father    Diabetes Father    Hypertension Father    Hypertension Sister    Hypertension Maternal Grandmother    Cancer Maternal Grandfather    Cancer Paternal Grandmother    Cancer Paternal Grandfather    Colon cancer Neg Hx    Esophageal cancer Neg Hx    Stomach cancer Neg Hx    Rectal cancer Neg Hx  Social History   Tobacco Use   Smoking status: Never   Smokeless tobacco: Never  Substance Use Topics   Alcohol use: No    Comment: rarely   Drug use: No     Review of Systems:    Constitutional: No unintentional/unexplained weight loss, fever, chills, weakness  Cardiovascular: No chest pain Respiratory: No SOB  Gastrointestinal: See HPI and otherwise negative   Physical Exam:  Vital signs: BP (!) 142/76 (BP Location: Left Arm, Patient Position: Sitting, Cuff Size: Large)   Pulse (!) 54   Ht 6' (1.829 m) Comment: height mesured without shoes  Wt (!) 301 lb 2 oz (136.6 kg)   BMI 40.84 kg/m   Constitutional: Pleasant, obese male in NAD, anxious, alert and cooperative Head:  Normocephalic and atraumatic.  Eyes: No scleral icterus.  Respiratory: Respirations even and unlabored. Lungs clear to auscultation bilaterally.  No wheezes, crackles, or rhonchi.  Cardiovascular:  Regular rate  and rhythm. No murmurs. No peripheral edema. Gastrointestinal:  Soft, nondistended, tender to palpation of RUQ. No rebound or guarding. Normal bowel sounds. No appreciable masses or hepatomegaly. Rectal:  Not performed.  Neurologic:  Alert and oriented x4;  grossly normal neurologically.  Skin:   Dry and intact without significant lesions or rashes. Psychiatric: Oriented to person, place and time. Demonstrates good judgement and reason without abnormal affect or behaviors.   RELEVANT LABS AND IMAGING: CBC    Component Value Date/Time   WBC 10.8 (H) 04/25/2010 2130   RBC 4.87 04/25/2010 2130   HGB 13.9 04/25/2010 2130   HCT 40.3 04/25/2010 2130   PLT 267 04/25/2010 2130   MCV 82.8 04/25/2010 2130   MCH 28.5 04/25/2010 2130   MCHC 34.5 04/25/2010 2130   RDW 15.0 04/25/2010 2130    CMP     Component Value Date/Time   NA 142 05/07/2010 2124   K 3.6 05/07/2010 2124   CL 104 05/07/2010 2124   CO2 30 05/07/2010 2124   GLUCOSE 86 05/07/2010 2124   BUN 16 05/07/2010 2124   CREATININE 1.07 05/07/2010 2124   CALCIUM 9.4 05/07/2010 2124   PROT 7.1 05/07/2010 2124   ALBUMIN 3.6 05/07/2010 2124   AST 17 05/07/2010 2124   ALT 29 05/07/2010 2124   ALKPHOS 64 05/07/2010 2124   BILITOT 0.5 05/07/2010 2124   GFRNONAA >60 05/07/2010 2124   GFRAA  05/07/2010 2124    >60        The eGFR has been calculated using the MDRD equation. This calculation has not been validated in all clinical situations. eGFR's persistently <60 mL/min signify possible Chronic Kidney Disease.     Assessment/Plan:   Assessment & Plan RUQ abdominal pain Epigastric pain Heartburn and indigestion NSAID use Patient with about 1 month of persistent RUQ abdominal pain.  Labs have been unremarkable with normal liver enzymes, normal lipase. Had an episode of significant heartburn at symptom onset.  Since then has continued to have right sided abdominal pain.   Has taken Prilosec intermittently which does seem  to help with symptoms but has not been taking consistently.  RUQ pain sometimes alleviated by eating but not always.   Has chronic NSAID use for back pain, only discontinued use a couple days ago.  Recently also discontinued use of alcohol though he was only drinking intermittently. Had a right upper quadrant ultrasound ordered by his PCP which showed hepatic steatosis and otherwise unremarkable with no concerning findings of the gallbladder. No prior EGD. Differential diagnosis includes peptic  ulcer disease, gastritis, biliary dyskinesia.  - Schedule upper endoscopy. I thoroughly discussed the procedure with the patient to include nature of the procedure, alternatives, benefits, and risks (including but not limited to bleeding, infection, perforation, anesthesia/cardiac/pulmonary complications). Patient verbalized understanding and gave verbal consent to proceed with procedure.  - Start omeprazole  40 mg daily - Advised to avoid meloxicam, consult orthopedist for alternative pain management. - Discussed potential HIDA scan if symptoms worsen or endoscopy is normal. - Educated on NSAID risks and PPI benefits.  Hepatic steatosis Patient with history of hepatic steatosis as seen on CT scan in 2023.  Previously saw Dr. Avram for this and was advised regarding low carbohydrate eating, weight loss, etc.  He is concerned that RUQ pain is related to his liver.  He is admittedly very anxious regarding health concerns since his wife had breast cancer.   Recent ultrasound shows hepatic steatosis but no other concerning findings.  Has had normal liver enzymes with calculated fib 4 score of 1.01, advanced fibrosis excluded. Given his concerns we will repeat a hepatic function panel today, rule out viral hepatitis, check PT/INR. He is interested in having a FibroScan.  Will see about adding him to a wait list for when we have this available in our office early next year.  - Continue weight loss and healthy  diet. - Place on waitlist for FibroScan. - Labs today: HFP, PT/INR, HepCAb, HepBsAg, HepBsAb, HepBcAb, HAV  - Scheduled follow-up post-endoscopy for further management.    Camie Furbish, PA-C [facility] 04/27/2024, 11:54 AM  Patient Care Team: Seabron Lenis, MD as PCP - General (Family Medicine) Jeffrie Oneil BROCKS, MD as PCP - Cardiology (Cardiology)

## 2024-04-28 LAB — HEPATITIS B SURFACE ANTIBODY,QUALITATIVE: Hep B S Ab: NONREACTIVE

## 2024-04-28 LAB — HEPATITIS B CORE ANTIBODY, TOTAL: Hep B Core Total Ab: NONREACTIVE

## 2024-04-28 LAB — HEPATITIS A ANTIBODY, TOTAL: Hepatitis A AB,Total: REACTIVE — AB

## 2024-04-28 LAB — HEPATITIS B SURFACE ANTIGEN: Hepatitis B Surface Ag: NONREACTIVE

## 2024-04-28 LAB — HEPATITIS C ANTIBODY: Hepatitis C Ab: NONREACTIVE

## 2024-04-30 ENCOUNTER — Ambulatory Visit: Payer: Self-pay | Admitting: Gastroenterology

## 2024-05-14 ENCOUNTER — Ambulatory Visit: Admitting: Internal Medicine

## 2024-05-14 ENCOUNTER — Encounter: Payer: Self-pay | Admitting: Internal Medicine

## 2024-05-14 VITALS — BP 117/66 | HR 54 | Temp 98.3°F | Resp 16 | Ht 72.0 in | Wt 301.0 lb

## 2024-05-14 DIAGNOSIS — K3189 Other diseases of stomach and duodenum: Secondary | ICD-10-CM | POA: Diagnosis not present

## 2024-05-14 DIAGNOSIS — K317 Polyp of stomach and duodenum: Secondary | ICD-10-CM

## 2024-05-14 DIAGNOSIS — R1011 Right upper quadrant pain: Secondary | ICD-10-CM

## 2024-05-14 DIAGNOSIS — K2950 Unspecified chronic gastritis without bleeding: Secondary | ICD-10-CM

## 2024-05-14 MED ORDER — SODIUM CHLORIDE 0.9 % IV SOLN: 500.0000 mL | INTRAVENOUS | Status: DC

## 2024-05-14 NOTE — Progress Notes (Signed)
 Report given to PACU, vss

## 2024-05-14 NOTE — Progress Notes (Signed)
 History and Physical Interval Note:  05/14/2024 10:17 AM  Ronald Roy  has presented today for endoscopic procedure(s), with the diagnosis of  Encounter Diagnosis  Name Primary?   Right upper quadrant abdominal pain Yes  .  The various methods of evaluation and treatment have been discussed with the patient and/or family. After consideration of risks, benefits and other options for treatment, the patient has consented to  the endoscopic procedure(s).   The patient's history has been reviewed, patient examined, no change in status, stable for endoscopic procedure(s).  I have reviewed the patient's chart and labs.  Questions were answered to the patient's satisfaction.     Lupita CHARLENA [name], MD, NOLIA

## 2024-05-14 NOTE — Progress Notes (Signed)
 Pt's states no medical or surgical changes since previsit or office visit.

## 2024-05-14 NOTE — Patient Instructions (Addendum)
 I found some small stomach polyps and biopsied them, + I took other stomach biopsies to look for inflammation and/or infection.  Nothing bad seen.  I think since you have stalled on weight loss you should consider taking something like Wegovy - you do meet criteria based upon BMI and you have hypertension and sleep apnea (associated co-morbidities).  I will let you know about pathology results.  I appreciate the opportunity to care for you. Lupita CHARLENA [name], MD, FACG  YOU HAD AN ENDOSCOPIC PROCEDURE TODAY AT THE [facility]:   Refer to the procedure report that was given to you for any specific questions about what was found during the examination.  If the procedure report does not answer your questions, please call your gastroenterologist to clarify.  If you requested that your care partner not be given the details of your procedure findings, then the procedure report has been included in a sealed envelope for you to review at your convenience later.  YOU SHOULD EXPECT: Some feelings of bloating in the abdomen. Passage of more gas than usual.  Walking can help get rid of the air that was put into your GI tract during the procedure and reduce the bloating. If you had a lower endoscopy (such as a colonoscopy or flexible sigmoidoscopy) you may notice spotting of blood in your stool or on the toilet paper. If you underwent a bowel prep for your procedure, you may not have a normal bowel movement for a few days.  Please Note:  You might notice some irritation and congestion in your nose or some drainage.  This is from the oxygen used during your procedure.  There is no need for concern and it should clear up in a day or so.  SYMPTOMS TO REPORT IMMEDIATELY:   Following upper endoscopy (EGD)  Vomiting of blood or coffee ground material  New chest pain or pain under the shoulder blades  Painful or persistently difficult swallowing  New shortness of breath  Fever of 100F or  higher  Black, tarry-looking stools  For urgent or emergent issues, a gastroenterologist can be reached at any hour by calling [phone]. Do not use MyChart messaging for urgent concerns.    DIET:  We do recommend a small meal at first, but then you may proceed to your regular diet.  Drink plenty of fluids but you should avoid alcoholic beverages for 24 hours.  ACTIVITY:  You should plan to take it easy for the rest of today and you should NOT DRIVE or use heavy machinery until tomorrow (because of the sedation medicines used during the test).    FOLLOW UP: Our staff will call the number listed on your records the next business day following your procedure.  We will call around 7:15- 8:00 am to check on you and address any questions or concerns that you may have regarding the information given to you following your procedure. If we do not reach you, we will leave a message.     If any biopsies were taken you will be contacted by phone or by letter within the next 1-3 weeks.  Please call us  at [phone] if you have not heard about the biopsies in 3 weeks.    SIGNATURES/CONFIDENTIALITY: You and/or your care partner have signed paperwork which will be entered into your electronic medical record.  These signatures attest to the fact that that the information above on your After Visit Summary has been reviewed and is understood.  Full  responsibility of the confidentiality of this discharge information lies with you and/or your care-partner.   Continue present medications Await pathology results Consider [facility] for weight loss

## 2024-05-14 NOTE — Progress Notes (Signed)
1020 Robinul 0.1 mg IV given due large amount of secretions upon assessment.  MD made aware, vss 

## 2024-05-14 NOTE — Progress Notes (Signed)
 Called to room to assist during endoscopic procedure.  Patient ID and intended procedure confirmed with present staff. Received instructions for my participation in the procedure from the performing physician.

## 2024-05-14 NOTE — Op Note (Addendum)
 [facility] Patient Name: Ronald Roy Procedure Date: 05/14/2024 10:18 AM MRN: [id number] Endoscopist: Lupita FORBES [name] , MD, [id number] Age: 53 Referring MD:  Date of Birth: 1970-06-04 Gender: Male Account #: [id number] Procedure:                Upper GI endoscopy Indications:              Abdominal pain in the right upper quadrant Medicines:                Monitored Anesthesia Care Procedure:                Pre-Anesthesia Assessment:                           - Prior to the procedure, a History and Physical                            was performed, and patient medications and                            allergies were reviewed. The patient's tolerance of                            previous anesthesia was also reviewed. The risks                            and benefits of the procedure and the sedation                            options and risks were discussed with the patient.                            All questions were answered, and informed consent                            was obtained. Prior Anticoagulants: The patient has                            taken no anticoagulant or antiplatelet agents. ASA                            Grade Assessment: III - A patient with severe                            systemic disease. After reviewing the risks and                            benefits, the patient was deemed in satisfactory                            condition to undergo the procedure.                           After obtaining informed consent, the endoscope was  passed under direct vision. Throughout the                            procedure, the patient's blood pressure, pulse, and                            oxygen saturations were monitored continuously. The                            GIF [id number] #[id number] was introduced through the                            mouth, and advanced to the second part of duodenum.                            The  upper GI endoscopy was accomplished without                            difficulty. The patient tolerated the procedure                            well. Scope In: Scope Out: Findings:                 A few small sessile polyps were found in the                            gastric fundus and in the gastric body. Biopsies                            were taken with a cold forceps for histology.                            Verification of patient identification for the                            specimen was done. Estimated blood loss was minimal.                           Patchy mildly erythematous mucosa was found in the                            gastric antrum. Biopsies were taken with a cold                            forceps for histology. Verification of patient                            identification for the specimen was done. Estimated                            blood loss was minimal.  The Z-line was irregular.                           The gastroesophageal flap valve was visualized                            endoscopically and classified as Hill Grade II                            (fold present, opens with respiration).                           The exam was otherwise without abnormality.                           The cardia and gastric fundus were otherwise normal                            on retroflexion. Complications:            No immediate complications. Estimated Blood Loss:     Estimated blood loss was minimal. Impression:               - A few gastric polyps. Biopsied.                           - Erythematous mucosa in the antrum. Biopsied.                           - Z-line irregular.                           - Gastroesophageal flap valve classified as Hill                            Grade II (fold present, opens with respiration).                           - The examination was otherwise normal. Recommendation:           - Patient has a  contact number available for                            emergencies. The signs and symptoms of potential                            delayed complications were discussed with the                            patient. Return to normal activities tomorrow.                            Written discharge instructions were provided to the                            patient.                           -  Continue present medications.                           - Await pathology results.                           - Consider [name] for weight loss                           ADDENDUM: Conversation in recovery w/ wife -                            patient has debilitating episodic RUQ and flank                            pain not related to eating. Also has degenerative                            spine disease - have suggested he return to spine                            specialist as this could be source of pain                            (radicular). Lupita FORBES [name], MD 05/14/2024 10:47:33 AM This report has been signed electronically.

## 2024-05-15 ENCOUNTER — Telehealth: Payer: Self-pay | Admitting: *Deleted

## 2024-05-15 NOTE — Telephone Encounter (Signed)
" °  Follow up Call-     05/14/2024    9:31 AM 08/21/2021    7:53 AM  Call back number  Post procedure Call Back phone  # [phone]  Permission to leave phone message Yes Yes     Patient questions:  Do you have a fever, pain , or abdominal swelling? No. Pain Score  0 *  Have you tolerated food without any problems? Yes.    Have you been able to return to your normal activities? Yes.    Do you have any questions about your discharge instructions: Diet   No. Medications  No. Follow up visit  No.  Do you have questions or concerns about your Care? No.  Actions: * If pain score is 4 or above: No action needed, pain <4.   "

## 2024-05-21 LAB — SURGICAL PATHOLOGY

## 2024-05-22 ENCOUNTER — Other Ambulatory Visit: Payer: Self-pay

## 2024-05-22 ENCOUNTER — Telehealth: Payer: Self-pay | Admitting: Gastroenterology

## 2024-05-22 ENCOUNTER — Ambulatory Visit (INDEPENDENT_AMBULATORY_CARE_PROVIDER_SITE_OTHER)

## 2024-05-22 DIAGNOSIS — J309 Allergic rhinitis, unspecified: Secondary | ICD-10-CM

## 2024-05-22 MED ORDER — OMEPRAZOLE 40 MG PO CPDR: 40.0000 mg | DELAYED_RELEASE_CAPSULE | Freq: Two times a day (BID) | ORAL | 0 refills | Status: DC

## 2024-05-22 NOTE — Telephone Encounter (Signed)
 PT is requesting to speak with a nurse regarding new symptoms he's having. Would not go into full details on what he's experiencing.  Please advise.

## 2024-05-22 NOTE — Telephone Encounter (Signed)
Information shared with the patient. 

## 2024-05-22 NOTE — Telephone Encounter (Signed)
 He can take omeprazole  40 mg bid as you suggested - I think he will need a new Rx for that and if so do # 90 no refill  Please remind him that I suggested he see his spine specaialist about RUQ pain that could be related to spine issues  Please let him know that all stomach biopsies do not show any significant problems, some innocent, benign polyps and also some inactive gastritis  Schedule him to see me next available follow-up

## 2024-05-22 NOTE — Telephone Encounter (Signed)
 Patient contacted. No new symptoms. Reports persistent symptoms. He started probiotics 2 weeks ago. Unsure if this has made any difference. He is walking, eating mindfully and using Mylanta or TUMS PRN with some temporary relief. Patient reports he take omeprazole  40 mg tow every morning. His mornings start out symptomatic, improve during the day, and return in the evening. I have encouraged him to take the omeprazole  BID instead of once in the morning, to see if this gives him evening symptom relief. Patient states anxiety over his symptoms and is seeking reassurance. He asks to have his message addressed by Dr Avram upon his return.

## 2024-05-24 ENCOUNTER — Ambulatory Visit: Payer: Self-pay | Admitting: Internal Medicine

## 2024-06-07 ENCOUNTER — Ambulatory Visit: Admitting: Internal Medicine

## 2024-06-07 ENCOUNTER — Encounter: Payer: Self-pay | Admitting: Internal Medicine

## 2024-06-07 ENCOUNTER — Other Ambulatory Visit (INDEPENDENT_AMBULATORY_CARE_PROVIDER_SITE_OTHER)

## 2024-06-07 VITALS — BP 126/68 | HR 63 | Ht 73.0 in | Wt 295.4 lb

## 2024-06-07 DIAGNOSIS — R143 Flatulence: Secondary | ICD-10-CM | POA: Diagnosis not present

## 2024-06-07 DIAGNOSIS — R1013 Epigastric pain: Secondary | ICD-10-CM | POA: Diagnosis not present

## 2024-06-07 DIAGNOSIS — R141 Gas pain: Secondary | ICD-10-CM | POA: Diagnosis not present

## 2024-06-07 DIAGNOSIS — R1011 Right upper quadrant pain: Secondary | ICD-10-CM

## 2024-06-07 DIAGNOSIS — K760 Fatty (change of) liver, not elsewhere classified: Secondary | ICD-10-CM

## 2024-06-07 DIAGNOSIS — R142 Eructation: Secondary | ICD-10-CM

## 2024-06-07 MED ORDER — OMEPRAZOLE 40 MG PO CPDR: DELAYED_RELEASE_CAPSULE | ORAL | 1 refills | Status: DC

## 2024-06-07 NOTE — Progress Notes (Signed)
 "       Ronald Roy 53 y.o. [birth year] [id number]  Assessment & Plan:   Encounter Diagnoses  Name Primary?   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 - Ordered lactulose hydrogen breath test for hydrogen and methane levels.  Functional dyspepsia Chronic symptoms with partial response to acid suppression and dietary modification. - Recommended reduction of Premasol to 40 mg once daily after one month at higher dose. - Patient has been taking probiotic supplement recommend probiotic foods instead Recent EGD May 14, 2024 with fundic gland polyps, mild chronic inactive gastritis no H. pylori  Screening for celiac disease Low suspicion but screening indicated due to bowel habit changes and possible gluten sensitivity. - Ordered serologic testing for celiac disease.  Obesity Significant weight loss achieved, benefiting overall health and MASLD risk reduction. - Provided positive reinforcement for sustained weight loss and dietary changes. - Encouraged adherence to Mediterranean-style diet and avoidance of processed foods.   MAFLD/hepatic steatosis-continue to work on weight loss and diet changes as above, fib 4 calculation using labs from summer and recently is as follows:  FIB 4 1.23 this number currently excludes advanced fibrosis (I have used labs in our EMR and in the care everywhere area from [place] to calculate   CC: Ronald Lenis, MD     Subjective:   Chief Complaint: Burping gas left lower quadrant pain  HPI Discussed the use of AI scribe software for clinical note transcription with the patient, who gave verbal consent to proceed.  History of Present Illness   Ronald Roy is a 54  year old male with benign fundic gland polyps who presents for follow-up of right upper quadrant abdominal pain.  Abdominal Pain and Dyspepsia: - Persistent right upper quadrant and right-sided abdominal pain, present most of the time - Pain flares up before dinnertime and can be fairly debilitating - No radiation of pain to the back - Significant indigestion, bloating, and excessive gas (burping and flatulence) - Symptoms worsen after eating certain foods, such as spinach salad and pizza - Traveling to the [place] in late February and is concerned he might have a flareup and what to do when they are, problems have not occurred with food in the [place] in the past - He had described severe excruciating pain at times when he was in recovery after his recent December 2025 EGD, and I thought perhaps he could have some referred pain from the spine.  His orthopedist did not think it was likely as thoracic complaints have not existed before.  Patient has cervical and lumbar spinal degenerative problems but not thoracic or that are known.  Dietary Modifications and Weight Loss: - Significant dietary changes, now eating mostly fresh fruits, rice, salads, and Mediterranean-style home-cooked meals - Avoids processed foods, artificial sweeteners, and rarely consumes refined sugar - Weight loss of approximately 30 pounds over the last couple of years - Some improvement in symptoms with dietary changes - Started probiotic supplement and consumes fermented foods (sauerkraut, yogurt)  Bowel Habit Changes: - Intermittent changes in bowel habits - Loose stools that come and go  Medication Response: - Currently taking omeprazole  80 mg daily (40 mg twice daily) - Omeprazole  has helped reduce symptoms  Endoscopic and Specialist Findings: -  Upper endoscopy in December revealed inactive gastritis, benign growth of normal tissue, and several fundic gland polyps - Spine specialist indicated spinal issues  are too low to be responsible for abdominal pain or excessive gas  Gluten Sensitivity Screening: - No prior testing for gluten intolerance or allergy       Wt Readings from Last 3 Encounters:  06/07/24 295 lb 6 oz (134 kg)  05/14/24 (!) 301 lb (136.5 kg)  04/27/24 (!) 301 lb 2 oz (136.6 kg)     Allergies[1] Active Medications[2] Past Medical History:  Diagnosis Date   Allergic rhinitis, unspecified    Allergy    Anxiety    Asthma    Depression    ED (erectile dysfunction)    Gastroesophageal reflux disease without esophagitis    Gout, unspecified    Hx of adenomatous polyp of colon 08/21/2021   8 mm adenoma - recall 2030   Hyperlipidemia    Hypertension    Insomnia    Major depression, single episode    Migraine, unspecified, not intractable, without status migrainosus    Mild episode of recurrent major depressive disorder    Mixed hyperlipidemia    Obesity    OSA (obstructive sleep apnea)    Situational stress    Sleep apnea    Past Surgical History:  Procedure Laterality Date   COLONOSCOPY     TONSILLECTOMY     Social History   Social History Narrative   He is a merchandiser, 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   Alcohol occasional a few times a week from 1 beer at a time to several beers and maybe some scotch.  The several drinking episodes would be every other month or so.   Never smoker no drug use   3 children   family history includes Cancer in his maternal grandfather, paternal grandfather, and paternal grandmother; Colon polyps in his father and mother; Diabetes in his father; Hypertension in his father, maternal grandmother, mother, and sister.   Review of Systems As per HPI  Objective:   Physical Exam BP 126/68   Pulse 63   Ht 6' 1 (1.854 m)   Wt 295 lb 6 oz (134 kg)   BMI 38.97 kg/m      [1]  Allergies Allergen Reactions   Amlodipine Besylate Other (See Comments) and Swelling  [2]   Current Meds  Medication Sig   albuterol  (VENTOLIN  HFA) 108 (90 Base) MCG/ACT inhaler Inhale 2 puffs into the lungs every 4 (four) hours as needed for wheezing or shortness of breath (coughing fits).   atorvastatin (LIPITOR) 40 MG tablet Take 40 mg by mouth daily.   EPINEPHrine  0.3 mg/0.3 mL IJ SOAJ injection Inject 0.3 mg into the muscle as needed for anaphylaxis.   fluticasone (FLONASE) 50 MCG/ACT nasal spray 1-2 spray in each nostril Nasally Twice a day   hydrochlorothiazide (HYDRODIURIL) 25 MG tablet Take 25 mg by mouth daily.   nebivolol (BYSTOLIC) 2.5 MG tablet Take 2.5 mg by mouth daily.   SUMAtriptan (IMITREX) 50 MG tablet Take 1 tablet by mouth as needed.   traZODone (DESYREL) 50 MG tablet 1 tablet at bedtime as needed Orally Once a day   zolpidem (AMBIEN) 10 MG tablet 1 tablet Orally Once a day at bedtime for 90 days   [DISCONTINUED] omeprazole  (PRILOSEC) 40 MG capsule Take 1 capsule (40 mg total) by mouth 2 (two) times daily before a meal.   "

## 2024-06-07 NOTE — Patient Instructions (Signed)
 Your provider has requested that you go to the basement level for lab work before leaving today. Press B on the elevator. The lab is located at the first door on the left as you exit the elevator.  Due to recent changes in healthcare laws, you may see the results of your imaging and laboratory studies on MyChart before your provider has had a chance to review them.  We understand that in some cases there may be results that are confusing or concerning to you. Not all laboratory results come back in the same time frame and the provider may be waiting for multiple results in order to interpret others.  Please give us  48 hours in order for your provider to thoroughly review all the results before contacting the office for clarification of your results.   Take your omeprazole  twice daily for a month and then go to one daily. Take 30 minutes prior to food.  You have been given a testing kit to check for small intestine bacterial overgrowth (SIBO) which is completed by a company named [facility]. Make sure to return your test in the mail using the return mailing label given to you along with the kit. The test order, your demographic and insurance information have all already been sent to the company. [facility] will collect an upfront charge of $109.00 for commercial insurance plans and $229.00 if you are paying cash. The potential remaining total after claim submission and review is $120.00. Make sure to discuss with [facility] PRIOR to having the test to see if they have gotten information from your insurance company as to how much your testing will cost out of pocket, if any. Please contact [facility] at phone number [phone] to get instructions regarding how to perform the test as our office is unable to give specific testing instructions.   I appreciate the opportunity to care for you. Lupita [name], MD, [facility]

## 2024-06-09 LAB — TISSUE TRANSGLUTAMINASE, IGA: (tTG) Ab, IgA: <1 U/mL

## 2024-06-09 LAB — IGA: Immunoglobulin A: 146 mg/dL (ref 47–310)

## 2024-06-11 ENCOUNTER — Ambulatory Visit: Payer: Self-pay | Admitting: Internal Medicine

## 2024-06-12 ENCOUNTER — Encounter: Payer: Self-pay | Admitting: Internal Medicine

## 2024-06-14 NOTE — Telephone Encounter (Signed)
Called patient and went over instructions 

## 2024-06-16 ENCOUNTER — Other Ambulatory Visit: Payer: Self-pay | Admitting: Internal Medicine

## 2024-06-21 ENCOUNTER — Ambulatory Visit (INDEPENDENT_AMBULATORY_CARE_PROVIDER_SITE_OTHER)

## 2024-06-21 DIAGNOSIS — J301 Allergic rhinitis due to pollen: Secondary | ICD-10-CM

## 2024-06-22 NOTE — Progress Notes (Signed)
 VIALS MADE ON 06/22/24
